# Patient Record
Sex: Female | Born: 1960 | Race: White | Hispanic: No | Marital: Married | Smoking: Never smoker
Health system: Southern US, Community
[De-identification: ages and names within clinical notes are randomized; demographics above are authoritative.]

## PROBLEM LIST (undated history)

## (undated) DIAGNOSIS — F419 Anxiety disorder, unspecified: Secondary | ICD-10-CM

## (undated) DIAGNOSIS — K589 Irritable bowel syndrome without diarrhea: Secondary | ICD-10-CM

## (undated) DIAGNOSIS — K219 Gastro-esophageal reflux disease without esophagitis: Secondary | ICD-10-CM

## (undated) DIAGNOSIS — G43909 Migraine, unspecified, not intractable, without status migrainosus: Secondary | ICD-10-CM

## (undated) DIAGNOSIS — K5901 Slow transit constipation: Secondary | ICD-10-CM

## (undated) DIAGNOSIS — F329 Major depressive disorder, single episode, unspecified: Secondary | ICD-10-CM

## (undated) DIAGNOSIS — I10 Essential (primary) hypertension: Secondary | ICD-10-CM

## (undated) DIAGNOSIS — M1990 Unspecified osteoarthritis, unspecified site: Secondary | ICD-10-CM

## (undated) DIAGNOSIS — N8000 Endometriosis of the uterus, unspecified: Secondary | ICD-10-CM

## (undated) DIAGNOSIS — N946 Dysmenorrhea, unspecified: Secondary | ICD-10-CM

## (undated) DIAGNOSIS — D219 Benign neoplasm of connective and other soft tissue, unspecified: Secondary | ICD-10-CM

## (undated) DIAGNOSIS — F32A Depression, unspecified: Secondary | ICD-10-CM

## (undated) HISTORY — PX: ESOPHAGOGASTRODUODENOSCOPY: SHX1529

## (undated) HISTORY — PX: CHOLECYSTECTOMY: SHX55

## (undated) HISTORY — PX: OOPHORECTOMY: SHX86

## (undated) HISTORY — PX: KNEE ARTHROSCOPY: SUR90

## (undated) HISTORY — PX: COLONOSCOPY: SHX174

## (undated) HISTORY — PX: PELVIC LAPAROSCOPY: SHX162

## (undated) HISTORY — PX: ABDOMINAL HYSTERECTOMY: SHX81

---

## 2004-03-04 ENCOUNTER — Ambulatory Visit: Payer: Self-pay

## 2006-02-07 ENCOUNTER — Ambulatory Visit: Payer: Self-pay | Admitting: Obstetrics and Gynecology

## 2006-02-13 ENCOUNTER — Ambulatory Visit: Payer: Self-pay | Admitting: Obstetrics and Gynecology

## 2007-05-15 ENCOUNTER — Ambulatory Visit: Payer: Self-pay | Admitting: Obstetrics and Gynecology

## 2007-10-23 ENCOUNTER — Ambulatory Visit: Payer: Self-pay | Admitting: Obstetrics and Gynecology

## 2009-07-17 ENCOUNTER — Ambulatory Visit: Payer: Self-pay | Admitting: Obstetrics and Gynecology

## 2013-10-30 ENCOUNTER — Emergency Department: Payer: Self-pay | Admitting: Emergency Medicine

## 2014-02-07 ENCOUNTER — Ambulatory Visit: Payer: Self-pay | Admitting: Obstetrics and Gynecology

## 2014-02-10 ENCOUNTER — Ambulatory Visit: Payer: Self-pay | Admitting: Internal Medicine

## 2014-02-13 ENCOUNTER — Ambulatory Visit: Payer: Self-pay | Admitting: Obstetrics and Gynecology

## 2014-03-20 ENCOUNTER — Ambulatory Visit: Payer: Self-pay | Admitting: Unknown Physician Specialty

## 2014-05-15 ENCOUNTER — Ambulatory Visit
Admit: 2014-05-15 | Disposition: A | Discharge status: Home / Self Care | Payer: Self-pay | Attending: Obstetrics and Gynecology | Admitting: Obstetrics and Gynecology

## 2014-05-20 ENCOUNTER — Encounter: Payer: Self-pay | Admitting: Obstetrics and Gynecology

## 2014-05-26 LAB — SURGICAL PATHOLOGY

## 2014-06-11 ENCOUNTER — Emergency Department
Admission: EM | Admit: 2014-06-11 | Discharge: 2014-06-11 | Disposition: A | Discharge status: Home / Self Care | Payer: BC Managed Care – PPO | Attending: Emergency Medicine | Admitting: Emergency Medicine

## 2014-06-11 ENCOUNTER — Emergency Department: Payer: BC Managed Care – PPO

## 2014-06-11 DIAGNOSIS — Z90710 Acquired absence of both cervix and uterus: Secondary | ICD-10-CM | POA: Insufficient documentation

## 2014-06-11 DIAGNOSIS — Z9089 Acquired absence of other organs: Secondary | ICD-10-CM | POA: Insufficient documentation

## 2014-06-11 DIAGNOSIS — N200 Calculus of kidney: Secondary | ICD-10-CM | POA: Diagnosis not present

## 2014-06-11 DIAGNOSIS — Z79899 Other long term (current) drug therapy: Secondary | ICD-10-CM | POA: Insufficient documentation

## 2014-06-11 DIAGNOSIS — R1030 Lower abdominal pain, unspecified: Secondary | ICD-10-CM | POA: Diagnosis present

## 2014-06-11 HISTORY — DX: Gastro-esophageal reflux disease without esophagitis: K21.9

## 2014-06-11 HISTORY — DX: Major depressive disorder, single episode, unspecified: F32.9

## 2014-06-11 HISTORY — DX: Depression, unspecified: F32.A

## 2014-06-11 LAB — COMPREHENSIVE METABOLIC PANEL
ALT: 26 U/L (ref 14–54)
AST: 24 U/L (ref 15–41)
Albumin: 4.3 g/dL (ref 3.5–5.0)
Alkaline Phosphatase: 110 U/L (ref 38–126)
Anion gap: 8 (ref 5–15)
BILIRUBIN TOTAL: 0.5 mg/dL (ref 0.3–1.2)
BUN: 17 mg/dL (ref 6–20)
CO2: 24 mmol/L (ref 22–32)
Calcium: 9.7 mg/dL (ref 8.9–10.3)
Chloride: 108 mmol/L (ref 101–111)
Creatinine, Ser: 0.63 mg/dL (ref 0.44–1.00)
GLUCOSE: 113 mg/dL — AB (ref 65–99)
Potassium: 3.7 mmol/L (ref 3.5–5.1)
Sodium: 140 mmol/L (ref 135–145)
Total Protein: 7.4 g/dL (ref 6.5–8.1)

## 2014-06-11 LAB — URINALYSIS COMPLETE WITH MICROSCOPIC (ARMC ONLY)
BACTERIA UA: NONE SEEN
BILIRUBIN URINE: NEGATIVE
GLUCOSE, UA: NEGATIVE mg/dL
Ketones, ur: NEGATIVE mg/dL
LEUKOCYTES UA: NEGATIVE
Nitrite: NEGATIVE
Protein, ur: NEGATIVE mg/dL
Specific Gravity, Urine: 1.019 (ref 1.005–1.030)
pH: 5 (ref 5.0–8.0)

## 2014-06-11 LAB — CBC WITH DIFFERENTIAL/PLATELET
Basophils Absolute: 0.1 10*3/uL (ref 0–0.1)
Basophils Relative: 1 %
EOS ABS: 0.6 10*3/uL (ref 0–0.7)
EOS PCT: 6 %
HCT: 40.6 % (ref 35.0–47.0)
HEMOGLOBIN: 13.2 g/dL (ref 12.0–16.0)
LYMPHS ABS: 1.8 10*3/uL (ref 1.0–3.6)
Lymphocytes Relative: 18 %
MCH: 25.9 pg — AB (ref 26.0–34.0)
MCHC: 32.5 g/dL (ref 32.0–36.0)
MCV: 79.7 fL — AB (ref 80.0–100.0)
MONO ABS: 0.8 10*3/uL (ref 0.2–0.9)
MONOS PCT: 8 %
Neutro Abs: 6.6 10*3/uL — ABNORMAL HIGH (ref 1.4–6.5)
Neutrophils Relative %: 67 %
Platelets: 327 10*3/uL (ref 150–440)
RBC: 5.1 MIL/uL (ref 3.80–5.20)
RDW: 15.7 % — ABNORMAL HIGH (ref 11.5–14.5)
WBC: 10 10*3/uL (ref 3.6–11.0)

## 2014-06-11 LAB — LIPASE, BLOOD: Lipase: 29 U/L (ref 22–51)

## 2014-06-11 MED ORDER — ONDANSETRON HCL 4 MG/2ML IJ SOLN
4.0000 mg | Freq: Once | INTRAMUSCULAR | Status: AC
  Administered 2014-06-11: 4 mg via INTRAVENOUS

## 2014-06-11 MED ORDER — MORPHINE SULFATE 4 MG/ML IJ SOLN
INTRAMUSCULAR | Status: AC
  Administered 2014-06-11: 4 mg via INTRAVENOUS
  Filled 2014-06-11: qty 1

## 2014-06-11 MED ORDER — IOHEXOL 300 MG/ML  SOLN
100.0000 mL | Freq: Once | INTRAMUSCULAR | Status: AC | PRN
  Administered 2014-06-11: 100 mL via INTRAVENOUS

## 2014-06-11 MED ORDER — ONDANSETRON HCL 4 MG/2ML IJ SOLN
INTRAMUSCULAR | Status: AC
  Administered 2014-06-11: 4 mg via INTRAVENOUS
  Filled 2014-06-11: qty 2

## 2014-06-11 MED ORDER — IOHEXOL 240 MG/ML SOLN
25.0000 mL | Freq: Once | INTRAMUSCULAR | Status: AC | PRN
  Administered 2014-06-11: 25 mL via ORAL

## 2014-06-11 MED ORDER — MORPHINE SULFATE 4 MG/ML IJ SOLN
4.0000 mg | Freq: Once | INTRAMUSCULAR | Status: AC
  Administered 2014-06-11: 4 mg via INTRAVENOUS

## 2014-06-11 NOTE — ED Notes (Signed)
Patient began to have pain to left lower quadrant around midnight. Vomited in triage.

## 2014-06-11 NOTE — ED Notes (Signed)
Pt in with co left sided abd pain x 1 hr, did vomit, has had some burning on urination.

## 2014-06-11 NOTE — Discharge Instructions (Signed)

## 2014-06-11 NOTE — ED Provider Notes (Signed)
[facility] Emergency Department Provider Note  ____________________________________________  Time seen: 1:00 AM  I have reviewed the triage vital signs and the nursing notes.   HISTORY  Chief Complaint Abdominal Pain      HPI Emily Walker is a 54 y.o. female presents with lower quadrant abdominal pain described as 10 out of 10 and sharp acute onset 1 hour ago and persistent. Patient also admits to one episodes of nonbloody vomiting. No diarrhea. Of note patient also admits to some dysuria.     Past Medical History  Diagnosis Date  . Depression   . GERD (gastroesophageal reflux disease)     There are no active problems to display for this patient.   Past Surgical History  Procedure Laterality Date  . Abdominal hysterectomy    . Cholecystectomy      Current Outpatient Rx  Name  Route  Sig  Dispense  Refill  . MELATONIN GUMMIES PO   Oral   Take 10 mg by mouth daily.         . RABEprazole (ACIPHEX) 20 MG tablet   Oral   Take 1 tablet by mouth daily.      12   . sertraline (ZOLOFT) 100 MG tablet   Oral   Take 100 mg by mouth every morning.      5     Allergies Flagyl  No family history on file.  Social History History  Substance Use Topics  . Smoking status: Never Smoker   . Smokeless tobacco: Never Used  . Alcohol Use: No    Review of Systems  Constitutional: Negative for fever. Eyes: Negative for visual changes. ENT: Negative for sore throat. Cardiovascular: Negative for chest pain. Respiratory: Negative for shortness of breath. Gastrointestinal: Negative for abdominal pain, vomiting and diarrhea. Genitourinary: Positive for dysuria. Musculoskeletal: Negative for back pain. Skin: Negative for rash. Neurological: Negative for headaches, focal weakness or numbness.  10-point ROS otherwise negative.  ____________________________________________   PHYSICAL EXAM:  VITAL SIGNS: ED Triage Vitals  Enc Vitals  Group     BP 06/11/14 0020 136/96 mmHg     Pulse Rate 06/11/14 0020 65     Resp 06/11/14 0020 18     Temp 06/11/14 0020 98.5 F (36.9 C)     Temp Source 06/11/14 0020 Oral     SpO2 --      Weight --      Height --      Head Cir --      Peak Flow --      Pain Score 06/11/14 0021 10     Pain Loc --      Pain Edu? --      Excl. in [place]? --     Constitutional: Alert and oriented. Well appearing and in no distress. Eyes: Conjunctivae are normal. PERRL. Normal extraocular movements. ENT   Head: Normocephalic and atraumatic.   Nose: No congestion/rhinnorhea.   Mouth/Throat: Mucous membranes are moist.   Neck: No stridor. Cardiovascular: Normal rate, regular rhythm. Normal and symmetric distal pulses are present in all extremities. No murmurs, rubs, or gallops. Respiratory: Normal respiratory effort without tachypnea nor retractions. Breath sounds are clear and equal bilaterally. No wheezes/rales/rhonchi. Gastrointestinal: Soft and nontender. No distention. There is no CVA tenderness. Genitourinary: deferred Musculoskeletal: Nontender with normal range of motion in all extremities. No joint effusions.  No lower extremity tenderness nor edema. Neurologic:  Normal speech and language. No gross focal neurologic deficits are appreciated. Speech is normal.  Skin:  Skin is warm, dry and intact. No rash noted. Psychiatric: Mood and affect are normal. Speech and behavior are normal. Patient exhibits appropriate insight and judgment.  ____________________________________________    LABS (pertinent positives/negatives) Labs Reviewed  CBC WITH DIFFERENTIAL/PLATELET - Abnormal; Notable for the following:    MCV 79.7 (*)    MCH 25.9 (*)    RDW 15.7 (*)    Neutro Abs 6.6 (*)    All other components within normal limits  COMPREHENSIVE METABOLIC PANEL - Abnormal; Notable for the following:    Glucose, Bld 113 (*)    All other components within normal limits  LIPASE, BLOOD   URINALYSIS COMPLETEWITH MICROSCOPIC ([facility])     Labs Reviewed  CBC WITH DIFFERENTIAL/PLATELET - Abnormal; Notable for the following:    MCV 79.7 (*)    MCH 25.9 (*)    RDW 15.7 (*)    Neutro Abs 6.6 (*)    All other components within normal limits  COMPREHENSIVE METABOLIC PANEL - Abnormal; Notable for the following:    Glucose, Bld 113 (*)    All other components within normal limits  LIPASE, BLOOD  URINALYSIS COMPLETEWITH MICROSCOPIC [facility])        RADIOLOGY  CT abdomen and pelvis negative  ____________________________________________    ____________________________________________   INITIAL IMPRESSION / ASSESSMENT AND PLAN / ED COURSE  Pertinent labs & imaging results that were available during my care of the patient were reviewed by me and considered in my medical decision making (see chart for details).  Given history and physical exam, CT scan of the abdomen and pelvis which was negative, urinalysis which revealed too numerous to count red blood cells likely etiology patient's pain and kidney stone. Following IV morphine and Zofran patient reevaluated and pain completely resolved resolution of pain most likely secondary to analgesia or given absence of stone on CT possibly patient haspassed her kidney stone.  ____________________________________________   FINAL CLINICAL IMPRESSION(S) / ED DIAGNOSES  Final diagnoses:  Kidney stone on left side      Gregor Hams, MD 06/11/14 [phone]

## 2014-06-17 ENCOUNTER — Encounter: Payer: Self-pay | Admitting: Genetic Counselor

## 2014-06-17 DIAGNOSIS — Z1379 Encounter for other screening for genetic and chromosomal anomalies: Secondary | ICD-10-CM | POA: Insufficient documentation

## 2014-06-17 NOTE — Progress Notes (Signed)
GENETIC TEST RESULTS  Patient Name: Emily Walker Patient Age: 54 y.o. Encounter Date: 06/17/2014  Referring Physician: Benjaman Kindler, MD   [name] was called today to discuss genetic test results. Please see the Genetics note from her visit to [facility] on 05/20/14 for a detailed discussion of her personal and family history.  GENETIC TESTING: At the time of [name]'s visit, we recommended she pursue genetic testing of multiple genes on the OvaNext gene panel. This test, which included sequencing and deletion/duplication analysis of 24 genes, was performed at [facility]. Testing was normal and did not reveal a mutation in these genes. The genes tested were ATM, BARD1, BRCA1, BRCA2, BRIP1, CDH1, CHEK2, EPCAM, MLH1, MRE11A, MSH2, MSH6, MUTYH, NBN, NF1, PALB2, PMS2, PTEN, RAD50, RAD51C, RAD51D, SMARCA4, STK11, and TP53.  We discussed with [name] that since the current test is not perfect, it is possible there may be a gene mutation that current testing cannot detect, but that chance is small. We also discussed that it is possible that a different genetic factor, which was not part of this testing or has not yet been discovered, is responsible for the cancer diagnoses in the family. Should [name] wish to discuss or pursue this additional testing, we are happy to coordinate this at any time, but do not feel that she is at significant risk of harboring a mutation in a different gene.     CANCER SCREENING: This normal result is reassuring and indicates that [name] does not likely have an increased risk of cancer due to a mutation in one of these genes. We recommended [name] continue to follow the cancer screening guidelines provided by her primary physician.   FAMILY MEMBERS: While these results are reassuring for [name], this test does not tell us anything about [name]' sister's genetic status as well as that of her cousin who had ovarian  cancer. They may wish to also have genetic counseling and testing. Please let us know if we can help facilitate testing. Genetic counselors can be located in other cities, by visiting the website of the [employer] (ArtistMovie.se) and Field seismologist for a Dietitian by zip code.  Lastly, we discussed with [name] that cancer genetics is a rapidly advancing field and it is possible that new genetic tests will be appropriate for her in the future. We encouraged her to remain in contact with [place] on an annual basis so we can update her personal and family histories, and let her know of advances in cancer genetics that may benefit the family. Our contact number was provided. [name] questions were answered to her satisfaction today, and she knows she is welcome to call anytime with additional questions.    Steele Berg, MS, [place] Certified Genetic Counselor phone: [phone] [email or web address]

## 2015-05-19 ENCOUNTER — Observation Stay
Admission: EM | Admit: 2015-05-19 | Discharge: 2015-05-20 | Disposition: A | Discharge status: Home / Self Care | Payer: BC Managed Care – PPO | Attending: Internal Medicine | Admitting: Internal Medicine

## 2015-05-19 ENCOUNTER — Encounter: Payer: Self-pay | Admitting: Emergency Medicine

## 2015-05-19 ENCOUNTER — Emergency Department: Payer: BC Managed Care – PPO

## 2015-05-19 DIAGNOSIS — R079 Chest pain, unspecified: Principal | ICD-10-CM | POA: Insufficient documentation

## 2015-05-19 DIAGNOSIS — R7989 Other specified abnormal findings of blood chemistry: Secondary | ICD-10-CM

## 2015-05-19 DIAGNOSIS — Z8249 Family history of ischemic heart disease and other diseases of the circulatory system: Secondary | ICD-10-CM | POA: Insufficient documentation

## 2015-05-19 DIAGNOSIS — K219 Gastro-esophageal reflux disease without esophagitis: Secondary | ICD-10-CM | POA: Diagnosis not present

## 2015-05-19 DIAGNOSIS — Z90710 Acquired absence of both cervix and uterus: Secondary | ICD-10-CM | POA: Insufficient documentation

## 2015-05-19 DIAGNOSIS — F329 Major depressive disorder, single episode, unspecified: Secondary | ICD-10-CM | POA: Insufficient documentation

## 2015-05-19 DIAGNOSIS — Z888 Allergy status to other drugs, medicaments and biological substances status: Secondary | ICD-10-CM | POA: Diagnosis not present

## 2015-05-19 DIAGNOSIS — Z9049 Acquired absence of other specified parts of digestive tract: Secondary | ICD-10-CM | POA: Diagnosis not present

## 2015-05-19 DIAGNOSIS — R778 Other specified abnormalities of plasma proteins: Secondary | ICD-10-CM

## 2015-05-19 LAB — CBC WITH DIFFERENTIAL/PLATELET
BASOS PCT: 1 %
Basophils Absolute: 0.1 10*3/uL (ref 0–0.1)
EOS ABS: 0.3 10*3/uL (ref 0–0.7)
Eosinophils Relative: 3 %
HCT: 41.6 % (ref 35.0–47.0)
HEMOGLOBIN: 13.8 g/dL (ref 12.0–16.0)
LYMPHS ABS: 1.9 10*3/uL (ref 1.0–3.6)
Lymphocytes Relative: 18 %
MCH: 26.6 pg (ref 26.0–34.0)
MCHC: 33.1 g/dL (ref 32.0–36.0)
MCV: 80.2 fL (ref 80.0–100.0)
Monocytes Absolute: 0.8 10*3/uL (ref 0.2–0.9)
Monocytes Relative: 8 %
NEUTROS ABS: 7.3 10*3/uL — AB (ref 1.4–6.5)
NEUTROS PCT: 70 %
Platelets: 340 10*3/uL (ref 150–440)
RBC: 5.18 MIL/uL (ref 3.80–5.20)
RDW: 15.9 % — ABNORMAL HIGH (ref 11.5–14.5)
WBC: 10.3 10*3/uL (ref 3.6–11.0)

## 2015-05-19 LAB — COMPREHENSIVE METABOLIC PANEL
ALBUMIN: 4.2 g/dL (ref 3.5–5.0)
ALT: 23 U/L (ref 14–54)
AST: 20 U/L (ref 15–41)
Alkaline Phosphatase: 118 U/L (ref 38–126)
Anion gap: 7 (ref 5–15)
BUN: 12 mg/dL (ref 6–20)
CALCIUM: 9.9 mg/dL (ref 8.9–10.3)
CO2: 26 mmol/L (ref 22–32)
CREATININE: 0.57 mg/dL (ref 0.44–1.00)
Chloride: 108 mmol/L (ref 101–111)
GFR calc Af Amer: >60 mL/min (ref >60)
GFR calc non Af Amer: >60 mL/min (ref >60)
Glucose, Bld: 106 mg/dL — ABNORMAL HIGH (ref 65–99)
Potassium: 3.7 mmol/L (ref 3.5–5.1)
Sodium: 141 mmol/L (ref 135–145)
Total Bilirubin: 0.9 mg/dL (ref 0.3–1.2)
Total Protein: 7.7 g/dL (ref 6.5–8.1)

## 2015-05-19 LAB — TROPONIN I: Troponin I: 0.06 ng/mL — ABNORMAL HIGH (ref <0.031)

## 2015-05-19 MED ORDER — NITROGLYCERIN 2 % TD OINT
1.0000 [in_us] | TOPICAL_OINTMENT | Freq: Once | TRANSDERMAL | Status: AC
  Administered 2015-05-19: 1 [in_us] via TOPICAL
  Filled 2015-05-19: qty 1

## 2015-05-19 MED ORDER — ASPIRIN EC 325 MG PO TBEC: 325.0000 mg | DELAYED_RELEASE_TABLET | Freq: Every day | ORAL | Status: DC

## 2015-05-19 MED ORDER — NITROGLYCERIN 2 % TD OINT: 0.5000 [in_us] | TOPICAL_OINTMENT | Freq: Four times a day (QID) | TRANSDERMAL | Status: DC

## 2015-05-19 MED ORDER — ASPIRIN 81 MG PO CHEW
81.0000 mg | CHEWABLE_TABLET | Freq: Every day | ORAL | Status: DC
  Administered 2015-05-20: 81 mg via ORAL
  Filled 2015-05-19: qty 1

## 2015-05-19 MED ORDER — PANTOPRAZOLE SODIUM 40 MG PO TBEC
40.0000 mg | DELAYED_RELEASE_TABLET | Freq: Every day | ORAL | Status: DC
  Administered 2015-05-19 – 2015-05-20 (×2): 40 mg via ORAL
  Filled 2015-05-19 (×2): qty 1

## 2015-05-19 MED ORDER — ASPIRIN 81 MG PO CHEW
324.0000 mg | CHEWABLE_TABLET | Freq: Once | ORAL | Status: AC
  Administered 2015-05-19: 324 mg via ORAL
  Filled 2015-05-19: qty 4

## 2015-05-19 MED ORDER — DIPHENHYDRAMINE HCL 25 MG PO CAPS
50.0000 mg | ORAL_CAPSULE | Freq: Every day | ORAL | Status: DC
  Administered 2015-05-19: 50 mg via ORAL
  Filled 2015-05-19 (×3): qty 2

## 2015-05-19 MED ORDER — IBUPROFEN 400 MG PO TABS
600.0000 mg | ORAL_TABLET | Freq: Every day | ORAL | Status: DC | PRN
  Administered 2015-05-20 (×2): 600 mg via ORAL
  Filled 2015-05-19 (×2): qty 2

## 2015-05-19 MED ORDER — GI COCKTAIL ~~LOC~~
30.0000 mL | Freq: Four times a day (QID) | ORAL | Status: DC | PRN
  Filled 2015-05-19: qty 30

## 2015-05-19 MED ORDER — ENOXAPARIN SODIUM 40 MG/0.4ML ~~LOC~~ SOLN
40.0000 mg | SUBCUTANEOUS | Status: DC
  Administered 2015-05-19: 40 mg via SUBCUTANEOUS
  Filled 2015-05-19 (×2): qty 0.4

## 2015-05-19 MED ORDER — ONDANSETRON HCL 4 MG/2ML IJ SOLN: 4.0000 mg | Freq: Four times a day (QID) | INTRAMUSCULAR | Status: DC | PRN

## 2015-05-19 MED ORDER — NITROGLYCERIN 2 % TD OINT
0.5000 [in_us] | TOPICAL_OINTMENT | Freq: Two times a day (BID) | TRANSDERMAL | Status: DC
  Filled 2015-05-19: qty 30

## 2015-05-19 MED ORDER — ACETAMINOPHEN 500 MG PO TABS
500.0000 mg | ORAL_TABLET | Freq: Every day | ORAL | Status: DC
  Administered 2015-05-19 – 2015-05-20 (×2): 500 mg via ORAL
  Filled 2015-05-19 (×2): qty 1

## 2015-05-19 MED ORDER — ACETAMINOPHEN 325 MG PO TABS: 650.0000 mg | ORAL_TABLET | ORAL | Status: DC | PRN

## 2015-05-19 MED ORDER — IBUPROFEN 400 MG PO TABS: 600.0000 mg | ORAL_TABLET | Freq: Every day | ORAL | Status: DC

## 2015-05-19 NOTE — ED Provider Notes (Signed)
[facility] Emergency Department Provider Note   ____________________________________________  Time seen:  I have reviewed the triage vital signs and the triage nursing note.  HISTORY  Chief Complaint Chest Pain   Historian Patient, husband also at the bedside  HPI Emily Walker is a 55 y.o. female is here for evaluation of chest pain. She's had several episodes since 2 days ago of central upper chest pain across the top of her chest. No nausea or sweating. No real shortness of breath. No pleuritic chest pain. One of the episodes started after drinking coffee and felt somewhat burning and GERD-like. She does have a history of GERD. She did take an extra AcipHex.  She does stress test once in the 1990s.  She follows with Dr. Ouida Sills for primary care physician.  She does not typically take aspirin. Nonsmoker. Not treated for diabetes, hypertension, or hyperlipidemia. She is overweight. Her father died in his 40s of MI.  She reports being under a lot of stress. 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    Past Medical History  Diagnosis Date  . Depression   . GERD (gastroesophageal reflux disease)     Patient Active Problem List   Diagnosis Date Noted  . Genetic testing 06/17/2014    Past Surgical History  Procedure Laterality Date  . Abdominal hysterectomy    . Cholecystectomy      Current Outpatient Rx  Name  Route  Sig  Dispense  Refill  . MELATONIN GUMMIES PO   Oral   Take 10 mg by mouth daily.         . RABEprazole (ACIPHEX) 20 MG tablet   Oral   Take 1 tablet by mouth daily.      12   . sertraline (ZOLOFT) 100 MG tablet   Oral   Take 100 mg by mouth every morning.      5     Allergies Flagyl  No family history on file.  Social History Social History  Substance Use Topics  . Smoking status: Never Smoker   . Smokeless tobacco: Never Used  . Alcohol Use: No    Review of Systems  Constitutional: Negative  for fever. Eyes: Negative for visual changes. ENT: Negative for sore throat. Cardiovascular: Positive for chest pain. Respiratory: Negative for shortness of breath. Gastrointestinal: Negative for abdominal pain, vomiting and diarrhea. Genitourinary: Negative for dysuria. Musculoskeletal: Negative for back pain. Skin: Negative for rash. Neurological: Negative for headache. 10 point Review of Systems otherwise negative ____________________________________________   PHYSICAL EXAM:  VITAL SIGNS: ED Triage Vitals  Enc Vitals Group     BP 05/19/15 1410 125/92 mmHg     Pulse Rate 05/19/15 1410 109     Resp 05/19/15 1410 16     Temp 05/19/15 1410 99.1 F (37.3 C)     Temp Source 05/19/15 1410 Oral     SpO2 05/19/15 1410 97 %     Weight 05/19/15 1410 243 lb (110.224 kg)     Height 05/19/15 1410 5\' 7"  (1.702 m)     Head Cir --      Peak Flow --      Pain Score --      Pain Loc --      Pain Edu? --      Excl. in [place]? --      Constitutional: Alert and oriented. Well appearing and in no distress. HEENT   Head: Normocephalic and atraumatic.  Eyes: Conjunctivae are normal. PERRL. Normal extraocular movements.      Ears:         Nose: No congestion/rhinnorhea.   Mouth/Throat: Mucous membranes are moist.   Neck: No stridor. Cardiovascular/Chest: Normal rate, regular rhythm.  No murmurs, rubs, or gallops. Respiratory: Normal respiratory effort without tachypnea nor retractions. Breath sounds are clear and equal bilaterally. No wheezes/rales/rhonchi. Gastrointestinal: Soft. No distention, no guarding, no rebound. Nontender.    Genitourinary/rectal:Deferred Musculoskeletal: Nontender with normal range of motion in all extremities. No joint effusions.  No lower extremity tenderness.  No edema. Neurologic:  Normal speech and language. No gross or focal neurologic deficits are appreciated. Skin:  Skin is warm, dry and intact. No rash noted. Psychiatric: Mood and affect are  normal. Speech and behavior are normal. Patient exhibits appropriate insight and judgment.  ____________________________________________   EKG I, Lisa Roca, MD, the attending physician have personally viewed and interpreted all ECGs.  110 bpm. Sinus tachycardia with occasional PVC. Narrow QRS. Normal axis. Nonspecific T wave 3 and aVF. ____________________________________________  LABS (pertinent positives/negatives)  White blood count 10.3, hemoglobin 13.8 and platelet count 340 Conference metabolic panel without significant abnormalities Troponin 0.06  ____________________________________________  RADIOLOGY All Xrays were viewed by me. Imaging interpreted by Radiologist.  Chest two-view: No active cardiopulmonary disease. __________________________________________  PROCEDURES  Procedure(s) performed: None  Critical Care performed: None  ____________________________________________   ED COURSE / ASSESSMENT AND PLAN  Pertinent labs & imaging results that were available during my care of the patient were reviewed by me and considered in my medical decision making (see chart for details).   Symptoms do sound potentially clinically consistent with GERD, however, she has not had a cardiac workup, and her troponin is minimally elevated. Her initial EKG showed sinus tachycardia with some nonspecific T-wave changes and PVCs.  When I saw her she had normal sinus rhythm and had no ongoing chest pain. Nitroglycerin paste was given. 4 baby aspirins were given.  Patient will be admitted for cardiac rule out.   CONSULTATIONS: Hospitalist, Dr. Manuella Ghazi for admission.   Patient / Family / Caregiver informed of clinical course, medical decision-making process, and agree with plan.     ___________________________________________   FINAL CLINICAL IMPRESSION(S) / ED DIAGNOSES   Final diagnoses:  Elevated troponin  Chest pain, unspecified              Note:  This dictation was prepared with Dragon dictation. Any transcriptional errors that result from this process are unintentional   Lisa Roca, MD 05/19/15 [phone]

## 2015-05-19 NOTE — ED Notes (Signed)
Reports chest pain yesterday and the day before.  No pain today.  Her MD recommended she come to ER for further eval.

## 2015-05-19 NOTE — H&P (Signed)
[facility] NAME: Emily Walker    MR#:  [id number]  DATE OF BIRTH:  1960-05-21  DATE OF ADMISSION:  05/19/2015  PRIMARY CARE PHYSICIAN: Kirk Ruths., MD   REQUESTING/REFERRING PHYSICIAN: Lisa Roca, MD  CHIEF COMPLAINT:   Chief Complaint  Patient presents with  . Chest Pain    HISTORY OF PRESENT ILLNESS:  Emily Walker  is a 55 y.o. female with a known history of GERD is being admitted for chest pain.  He has been having on and off chest pain over the weekend.  Radiating all across her chest and also in her back. One of the episodes started after drinking coffee and felt somewhat burning and GERD-like so she took AcipHex but did not get relieved.  Considering her family history of early heart disease, called her primary care physician who requested her to go to the [facility] walk-in clinic who requested her to come to the emergency department.  PAST MEDICAL HISTORY:   Past Medical History  Diagnosis Date  . Depression   . GERD (gastroesophageal reflux disease)     PAST SURGICAL HISTORY:   Past Surgical History  Procedure Laterality Date  . Abdominal hysterectomy    . Cholecystectomy      SOCIAL HISTORY:   Social History  Substance Use Topics  . Smoking status: Never Smoker   . Smokeless tobacco: Never Used  . Alcohol Use: No   works as a Nurse, adult at [employer]:  No family history on file. Father died of MI in 46s DRUG ALLERGIES:   Allergies  Allergen Reactions  . Flagyl [Metronidazole] Nausea And Vomiting    REVIEW OF SYSTEMS:   Review of Systems  Constitutional: Negative for fever, weight loss, malaise/fatigue and diaphoresis.  HENT: Negative for ear discharge, ear pain, hearing loss, nosebleeds, sore throat and tinnitus.   Eyes: Negative for blurred vision and pain.  Respiratory: Negative for cough, hemoptysis, shortness of breath and wheezing.   Cardiovascular: Positive  for chest pain. Negative for palpitations, orthopnea and leg swelling.  Gastrointestinal: Positive for heartburn. Negative for nausea, vomiting, abdominal pain, diarrhea, constipation and blood in stool.  Genitourinary: Negative for dysuria, urgency and frequency.  Musculoskeletal: Negative for myalgias and back pain.  Skin: Negative for itching and rash.  Neurological: Negative for dizziness, tingling, tremors, focal weakness, seizures, weakness and headaches.  Psychiatric/Behavioral: Negative for depression. The patient is not nervous/anxious.     MEDICATIONS AT HOME:   Prior to Admission medications   Medication Sig Start Date End Date Taking? Authorizing Provider  acetaminophen (TYLENOL) 500 MG tablet Take 500 mg by mouth daily.   Yes Historical Provider, MD  diphenhydrAMINE (BENADRYL) 25 MG tablet Take 50 mg by mouth at bedtime.   Yes Historical Provider, MD  ibuprofen (ADVIL,MOTRIN) 600 MG tablet Take 600 mg by mouth daily.   Yes Historical Provider, MD  MELATONIN GUMMIES PO Take 1 each by mouth at bedtime.    Yes Historical Provider, MD  RABEprazole (ACIPHEX) 20 MG tablet Take 20 mg by mouth daily.    Yes Historical Provider, MD      VITAL SIGNS:  Blood pressure 143/99, pulse 104, temperature 98.6 F (37 C), temperature source Oral, resp. rate 18, height 5\' 7"  (1.702 m), weight 110.224 kg (243 lb), SpO2 99 %.  PHYSICAL EXAMINATION:  Physical Exam  Constitutional: She is oriented to person, place, and time and well-developed, well-nourished, and in no distress.  HENT:  Head: Normocephalic and atraumatic.  Eyes: Conjunctivae and EOM are normal. Pupils are equal, round, and reactive to light.  Neck: Normal range of motion. Neck supple. No tracheal deviation present. No thyromegaly present.  Cardiovascular: Normal rate, regular rhythm and normal heart sounds.   Pulmonary/Chest: Effort normal and breath sounds normal. No respiratory distress. She has no wheezes. She exhibits no  tenderness.  Abdominal: Soft. Bowel sounds are normal. She exhibits no distension. There is no tenderness.  Musculoskeletal: Normal range of motion.  Neurological: She is alert and oriented to person, place, and time. No cranial nerve deficit.  Skin: Skin is warm and dry. No rash noted.  Psychiatric: Mood and affect normal.   LABORATORY PANEL:   CBC  Recent Labs Lab 05/19/15 1417  WBC 10.3  HGB 13.8  HCT 41.6  PLT 340   ------------------------------------------------------------------------------------------------------------------  Chemistries   Recent Labs Lab 05/19/15 1417  NA 141  K 3.7  CL 108  CO2 26  GLUCOSE 106*  BUN 12  CREATININE 0.57  CALCIUM 9.9  AST 20  ALT 23  ALKPHOS 118  BILITOT 0.9   ------------------------------------------------------------------------------------------------------------------  Cardiac Enzymes  Recent Labs Lab 05/19/15 1417  TROPONINI 0.06*   ------------------------------------------------------------------------------------------------------------------  RADIOLOGY:  Dg Chest 2 View  05/19/2015  CLINICAL DATA:  Chest pain since yesterday. EXAM: CHEST  2 VIEW COMPARISON:  None. FINDINGS: The lungs are clear wiithout focal pneumonia, edema, pneumothorax or pleural effusion. The cardiopericardial silhouette is within normal limits for size. The visualized bony structures of the thorax are intact. IMPRESSION: No active cardiopulmonary disease. Electronically Signed   By: Misty Stanley M.D.   On: 05/19/2015 16:52   IMPRESSION AND PLAN:  55 y.o. female is here for evaluation of chest pain  * Chest pain - Serial troponins - Telemetry - Aspirin, nitroglycerin - Myoview in the morning - Unlikely cardiac, but considering significant family history.  We will admit her under observation  * Borderline elevated troponin - Likely due to supply demand ischemia although cannot rule out MI at this time - We will do serial  troponins  * GERD - Continue PPI    All the records are reviewed and case discussed with ED provider. Management plans discussed with the patient, family and they are in agreement.  CODE STATUS: Full code  TOTAL TIME TAKING CARE OF THIS PATIENT: 35 minutes.    [facility], Ralph Benavidez M.D on 05/19/2015 at 7:18 PM  Between 7am to 6pm - Pager - [phone]  After 6pm go to [email or web address] - password [email or web address] [facility]  Office  [phone]  CC: Primary care physician; Kirk Ruths., MD   Note: This dictation was prepared with Dragon dictation along with smaller phrase technology. Any transcriptional errors that result from this process are unintentional.

## 2015-05-19 NOTE — ED Notes (Signed)
Lab called with troponin of 0.06  Dr Archie Balboa aware.

## 2015-05-19 NOTE — Progress Notes (Signed)
Patient arrived to 2A Room 237. Patient denies pain and all questions answered. Patient oriented to unit and Fall Safety Plan signed. Skin assessment completed with Alisa RN and skin intact. A&Ox4, VSS, and NSR on tele box #[id number]. Nursing staff will continue to monitor. Earleen Reaper, RN

## 2015-05-19 NOTE — ED Notes (Signed)
While updating pt's vitals pt expressed anxiety about why she brought back and what exactly it meant to have an elevated troponin, spoke with Shirlee Limerick, RN in reference to this and pt concern and she stated she would go over what that means with pt.

## 2015-05-19 NOTE — ED Notes (Addendum)
Pt reports chest pain since yesterday, not currently having pain but pt states when she had the pain it was "All over, it radiated into my neck, back and both arms" Pt reports she thinks it might be from anxiety or from drinking caffeine B/C she has GERD

## 2015-05-20 ENCOUNTER — Observation Stay: Payer: BC Managed Care – PPO

## 2015-05-20 DIAGNOSIS — R079 Chest pain, unspecified: Secondary | ICD-10-CM | POA: Diagnosis not present

## 2015-05-20 LAB — NM MYOCAR MULTI W/SPECT W/WALL MOTION / EF
CSEPED: 1 min
CSEPEDS: 22 s
CSEPEW: 1 METS
CSEPPHR: 110 {beats}/min
LV dias vol: 75 mL (ref 46–106)
LVSYSVOL: 35 mL
Rest HR: 83 {beats}/min
SDS: 0
SRS: 0
SSS: 3
TID: 0.66

## 2015-05-20 LAB — TROPONIN I
Troponin I: <0.03 ng/mL (ref <0.031)
Troponin I: <0.03 ng/mL (ref <0.031)

## 2015-05-20 MED ORDER — TECHNETIUM TC 99M SESTAMIBI - CARDIOLITE
29.7700 | Freq: Once | INTRAVENOUS | Status: AC | PRN
Start: 2015-05-20 — End: 2015-05-20
  Administered 2015-05-20: 13:00:00 29.77 via INTRAVENOUS

## 2015-05-20 MED ORDER — TECHNETIUM TC 99M SESTAMIBI - CARDIOLITE
13.3710 | Freq: Once | INTRAVENOUS | Status: AC | PRN
  Administered 2015-05-20: 13.371 via INTRAVENOUS

## 2015-05-20 MED ORDER — REGADENOSON 0.4 MG/5ML IV SOLN
0.4000 mg | Freq: Once | INTRAVENOUS | Status: AC
  Administered 2015-05-20: 0.4 mg via INTRAVENOUS

## 2015-05-20 NOTE — Progress Notes (Signed)
Came with on-off chest pain- stresstest done- negatvie. Troponin negative.  Plan   D/c home today.

## 2015-05-20 NOTE — Progress Notes (Addendum)
Patient discharged home after low risk scan, IV site DCd, bleeding controlled, tele monitor turned in, DC instructions given to patient, understanding verbalized, pt left hospital in car with her family  No new scrips

## 2015-05-22 NOTE — Discharge Summary (Signed)
[facility] [place] NAME: Emily Walker    MR#:  [id number]  DATE OF BIRTH:  09/04/1960  DATE OF ADMISSION:  05/19/2015 ADMITTING PHYSICIAN: Max Sane, MD  DATE OF DISCHARGE: 05/20/2015  4:59 PM  PRIMARY CARE PHYSICIAN: Kirk Ruths., MD    ADMISSION DIAGNOSIS:  Chest pain, unspecified [R07.9] Elevated troponin [R79.89]  DISCHARGE DIAGNOSIS:  Active Problems:   Chest pain   Stress test negative.  SECONDARY DIAGNOSIS:   Past Medical History  Diagnosis Date  . Depression   . GERD (gastroesophageal reflux disease)     HOSPITAL COURSE:   * Chest pain - Serial troponins- negatvie - Telemetry - Aspirin, nitroglycerin - Myoview in the morning- done- negative.  * Borderline elevated troponin - Likely due to supply demand ischemia   * GERD - Continue PPI  DISCHARGE CONDITIONS:   Stable.  CONSULTS OBTAINED:     DRUG ALLERGIES:   Allergies  Allergen Reactions  . Flagyl [Metronidazole] Nausea And Vomiting    DISCHARGE MEDICATIONS:   Discharge Medication List as of 05/20/2015  4:42 PM    CONTINUE these medications which have NOT CHANGED   Details  acetaminophen (TYLENOL) 500 MG tablet Take 500 mg by mouth daily., Until Discontinued, Historical Med    diphenhydrAMINE (BENADRYL) 25 MG tablet Take 50 mg by mouth at bedtime., Until Discontinued, Historical Med    ibuprofen (ADVIL,MOTRIN) 600 MG tablet Take 600 mg by mouth daily., Until Discontinued, Historical Med    MELATONIN GUMMIES PO Take 1 each by mouth at bedtime. , Until Discontinued, Historical Med    RABEprazole (ACIPHEX) 20 MG tablet Take 20 mg by mouth daily. , Until Discontinued, Historical Med         DISCHARGE INSTRUCTIONS:   Follow with PMD in 2 weeks.  If you experience worsening of your admission symptoms, develop shortness of breath, life threatening emergency, suicidal or homicidal thoughts you must seek medical attention  immediately by calling 911 or calling your MD immediately  if symptoms less severe.  You Must read complete instructions/literature along with all the possible adverse reactions/side effects for all the Medicines you take and that have been prescribed to you. Take any new Medicines after you have completely understood and accept all the possible adverse reactions/side effects.   Please note  You were cared for by a hospitalist during your hospital stay. If you have any questions about your discharge medications or the care you received while you were in the hospital after you are discharged, you can call the unit and asked to speak with the hospitalist on call if the hospitalist that took care of you is not available. Once you are discharged, your primary care physician will handle any further medical issues. Please note that NO REFILLS for any discharge medications will be authorized once you are discharged, as it is imperative that you return to your primary care physician (or establish a relationship with a primary care physician if you do not have one) for your aftercare needs so that they can reassess your need for medications and monitor your lab values.    Today   CHIEF COMPLAINT:   Chief Complaint  Patient presents with  . Chest Pain    HISTORY OF PRESENT ILLNESS:  Emily Walker  is a 56 y.o. female with a known history of GERD is being admitted for chest pain. He has been having on and off chest pain over the weekend. Radiating all across  her chest and also in her back. One of the episodes started after drinking coffee and felt somewhat burning and GERD-like so she took AcipHex but did not get relieved. Considering her family history of early heart disease, called her primary care physician who requested her to go to the [facility] walk-in clinic who requested her to come to the emergency department.    VITAL SIGNS:  Blood pressure 128/81, pulse 87, temperature 97.8 F (36.6 C),  temperature source Oral, resp. rate 18, height 5\' 7"  (1.702 m), weight 110.406 kg (243 lb 6.4 oz), SpO2 95 %.  I/O:  No intake or output data in the 24 hours ending 05/22/15 2216  PHYSICAL EXAMINATION:  GENERAL:  55 y.o.-year-old patient lying in the bed with no acute distress.  EYES: Pupils equal, round, reactive to light and accommodation. No scleral icterus. Extraocular muscles intact.  HEENT: Head atraumatic, normocephalic. Oropharynx and nasopharynx clear.  NECK:  Supple, no jugular venous distention. No thyroid enlargement, no tenderness.  LUNGS: Normal breath sounds bilaterally, no wheezing, rales,rhonchi or crepitation. No use of accessory muscles of respiration.  CARDIOVASCULAR: S1, S2 normal. No murmurs, rubs, or gallops.  ABDOMEN: Soft, non-tender, non-distended. Bowel sounds present. No organomegaly or mass.  EXTREMITIES: No pedal edema, cyanosis, or clubbing.  NEUROLOGIC: Cranial nerves II through XII are intact. Muscle strength 5/5 in all extremities. Sensation intact. Gait not checked.  PSYCHIATRIC: The patient is alert and oriented x 3.  SKIN: No obvious rash, lesion, or ulcer.   DATA REVIEW:   CBC  Recent Labs Lab 05/19/15 1417  WBC 10.3  HGB 13.8  HCT 41.6  PLT 340    Chemistries   Recent Labs Lab 05/19/15 1417  NA 141  K 3.7  CL 108  CO2 26  GLUCOSE 106*  BUN 12  CREATININE 0.57  CALCIUM 9.9  AST 20  ALT 23  ALKPHOS 118  BILITOT 0.9    Cardiac Enzymes  Recent Labs Lab 05/20/15 1016  TROPONINI <0.03    Microbiology Results  No results found for this or any previous visit.  RADIOLOGY:  No results found.  EKG:   Orders placed or performed during the hospital encounter of 05/19/15  . EKG 12-Lead  . EKG 12-Lead  . ED EKG  . ED EKG  . EKG      Management plans discussed with the patient, family and they are in agreement.  CODE STATUS:  Code Status History    Date Active Date Inactive Code Status Order ID Comments User  Context   05/19/2015  8:41 PM 05/20/2015  7:59 PM Full Code [id number]  Max Sane, MD Inpatient      TOTAL TIME TAKING CARE OF THIS PATIENT: 35 minutes.    Vaughan Basta M.D on 05/22/2015 at 10:16 PM  Between 7am to 6pm - Pager - [phone]  After 6pm go to [email or web address] - password [email or web address] [facility]  Office  [phone]  CC: Primary care physician; Kirk Ruths., MD   Note: This dictation was prepared with Dragon dictation along with smaller phrase technology. Any transcriptional errors that result from this process are unintentional.

## 2015-09-10 ENCOUNTER — Emergency Department: Payer: BC Managed Care – PPO

## 2015-09-10 ENCOUNTER — Emergency Department
Admission: EM | Admit: 2015-09-10 | Discharge: 2015-09-10 | Disposition: A | Discharge status: Home / Self Care | Payer: BC Managed Care – PPO | Attending: Emergency Medicine | Admitting: Emergency Medicine

## 2015-09-10 ENCOUNTER — Encounter: Payer: Self-pay | Admitting: Emergency Medicine

## 2015-09-10 DIAGNOSIS — R1031 Right lower quadrant pain: Secondary | ICD-10-CM | POA: Diagnosis present

## 2015-09-10 DIAGNOSIS — N201 Calculus of ureter: Secondary | ICD-10-CM

## 2015-09-10 LAB — URINALYSIS COMPLETE WITH MICROSCOPIC (ARMC ONLY)
BACTERIA UA: NONE SEEN
Bilirubin Urine: NEGATIVE
GLUCOSE, UA: NEGATIVE mg/dL
Ketones, ur: NEGATIVE mg/dL
Leukocytes, UA: NEGATIVE
Nitrite: NEGATIVE
Protein, ur: NEGATIVE mg/dL
Specific Gravity, Urine: 1.04 — ABNORMAL HIGH (ref 1.005–1.030)
pH: 6 (ref 5.0–8.0)

## 2015-09-10 LAB — COMPREHENSIVE METABOLIC PANEL
ALBUMIN: 4.2 g/dL (ref 3.5–5.0)
ALK PHOS: 93 U/L (ref 38–126)
ALT: 29 U/L (ref 14–54)
ANION GAP: 7 (ref 5–15)
AST: 25 U/L (ref 15–41)
BUN: 17 mg/dL (ref 6–20)
CALCIUM: 9.4 mg/dL (ref 8.9–10.3)
CO2: 23 mmol/L (ref 22–32)
Chloride: 110 mmol/L (ref 101–111)
Creatinine, Ser: 0.65 mg/dL (ref 0.44–1.00)
GFR calc Af Amer: >60 mL/min (ref >60)
GFR calc non Af Amer: >60 mL/min (ref >60)
GLUCOSE: 107 mg/dL — AB (ref 65–99)
POTASSIUM: 3.5 mmol/L (ref 3.5–5.1)
SODIUM: 140 mmol/L (ref 135–145)
Total Bilirubin: 0.6 mg/dL (ref 0.3–1.2)
Total Protein: 7 g/dL (ref 6.5–8.1)

## 2015-09-10 LAB — CBC
HEMATOCRIT: 39.3 % (ref 35.0–47.0)
HEMOGLOBIN: 13.5 g/dL (ref 12.0–16.0)
MCH: 27.8 pg (ref 26.0–34.0)
MCHC: 34.4 g/dL (ref 32.0–36.0)
MCV: 81 fL (ref 80.0–100.0)
Platelets: 272 10*3/uL (ref 150–440)
RBC: 4.85 MIL/uL (ref 3.80–5.20)
RDW: 15 % — ABNORMAL HIGH (ref 11.5–14.5)
WBC: 7.2 10*3/uL (ref 3.6–11.0)

## 2015-09-10 LAB — LIPASE, BLOOD: Lipase: 17 U/L (ref 11–51)

## 2015-09-10 MED ORDER — IOPAMIDOL (ISOVUE-300) INJECTION 61%
100.0000 mL | Freq: Once | INTRAVENOUS | Status: AC | PRN
  Administered 2015-09-10: 100 mL via INTRAVENOUS

## 2015-09-10 MED ORDER — DIATRIZOATE MEGLUMINE & SODIUM 66-10 % PO SOLN
15.0000 mL | Freq: Once | ORAL | Status: AC
  Administered 2015-09-10: 15 mL via ORAL

## 2015-09-10 MED ORDER — OXYCODONE-ACETAMINOPHEN 5-325 MG PO TABS
2.0000 | ORAL_TABLET | Freq: Once | ORAL | Status: AC
  Administered 2015-09-10: 2 via ORAL

## 2015-09-10 MED ORDER — MORPHINE SULFATE (PF) 4 MG/ML IV SOLN
4.0000 mg | Freq: Once | INTRAVENOUS | Status: AC
  Administered 2015-09-10: 4 mg via INTRAVENOUS
  Filled 2015-09-10: qty 1

## 2015-09-10 MED ORDER — ONDANSETRON HCL 4 MG/2ML IJ SOLN
INTRAMUSCULAR | Status: AC
  Administered 2015-09-10: 4 mg via INTRAVENOUS
  Filled 2015-09-10: qty 2

## 2015-09-10 MED ORDER — HYDROMORPHONE HCL 1 MG/ML IJ SOLN
1.0000 mg | Freq: Once | INTRAMUSCULAR | Status: AC
  Administered 2015-09-10: 1 mg via INTRAVENOUS

## 2015-09-10 MED ORDER — TAMSULOSIN HCL 0.4 MG PO CAPS: ORAL_CAPSULE | ORAL | 0 refills | Status: DC

## 2015-09-10 MED ORDER — DOCUSATE SODIUM 100 MG PO CAPS: ORAL_CAPSULE | ORAL | 0 refills | Status: AC

## 2015-09-10 MED ORDER — ONDANSETRON HCL 4 MG/2ML IJ SOLN
4.0000 mg | Freq: Once | INTRAMUSCULAR | Status: AC | PRN
  Administered 2015-09-10: 4 mg via INTRAVENOUS
  Filled 2015-09-10: qty 2

## 2015-09-10 MED ORDER — ONDANSETRON 4 MG PO TBDP: ORAL_TABLET | ORAL | 0 refills | Status: DC

## 2015-09-10 MED ORDER — ONDANSETRON HCL 4 MG/2ML IJ SOLN
4.0000 mg | INTRAMUSCULAR | Status: AC
  Administered 2015-09-10: 4 mg via INTRAVENOUS

## 2015-09-10 MED ORDER — MORPHINE SULFATE (PF) 4 MG/ML IV SOLN
INTRAVENOUS | Status: AC
  Administered 2015-09-10: 4 mg via INTRAVENOUS
  Filled 2015-09-10: qty 1

## 2015-09-10 MED ORDER — ONDANSETRON HCL 4 MG/2ML IJ SOLN
4.0000 mg | Freq: Once | INTRAMUSCULAR | Status: AC
  Administered 2015-09-10: 4 mg via INTRAVENOUS

## 2015-09-10 MED ORDER — MORPHINE SULFATE (PF) 4 MG/ML IV SOLN
4.0000 mg | Freq: Once | INTRAVENOUS | Status: AC
  Administered 2015-09-10: 4 mg via INTRAVENOUS

## 2015-09-10 MED ORDER — OXYCODONE-ACETAMINOPHEN 5-325 MG PO TABS
ORAL_TABLET | ORAL | Status: AC
  Administered 2015-09-10: 2 via ORAL
  Filled 2015-09-10: qty 2

## 2015-09-10 MED ORDER — OXYCODONE-ACETAMINOPHEN 5-325 MG PO TABS: 1.0000 | ORAL_TABLET | ORAL | 0 refills | Status: AC | PRN

## 2015-09-10 MED ORDER — HYDROMORPHONE HCL 1 MG/ML IJ SOLN
INTRAMUSCULAR | Status: AC
  Administered 2015-09-10: 1 mg via INTRAVENOUS
  Filled 2015-09-10: qty 1

## 2015-09-10 NOTE — ED Triage Notes (Addendum)
Pt to rm 12 from triage, c/o RLQ pain, states hx of kidney stones and feels similar.  Nausea reported.  Pt in mild distress, dry heaving at this time.

## 2015-09-10 NOTE — Discharge Instructions (Signed)
You have been seen in the Emergency Department (ED) today for pain that we believe based on your workup, is caused by kidney stones.  As we have discussed, please drink plenty of fluids.  Please make a follow up appointment with the physician(s) listed elsewhere in this documentation. ° °You may take pain medication as needed but ONLY as prescribed.  Please also take your prescribed Flomax daily.  We also recommend that you take over-the-counter ibuprofen regularly according to label instructions over the next 5 days.  Take it with meals to minimize stomach discomfort. ° °Please see your doctor as soon as possible as stones may take 1-3 weeks to pass and you may require additional care or medications. ° °Do not drink alcohol, drive or participate in any other potentially dangerous activities while taking opiate pain medication as it may make you sleepy. Do not take this medication with any other sedating medications, either prescription or over-the-counter. If you were prescribed Percocet or Vicodin, do not take these with acetaminophen (Tylenol) as it is already contained within these medications. °  °Take Percocet as needed for severe pain.  This medication is an opiate (or narcotic) pain medication and can be habit forming.  Use it as little as possible to achieve adequate pain control.  Do not use or use it with extreme caution if you have a history of opiate abuse or dependence.  If you are on a pain contract with your primary care doctor or a pain specialist, be sure to let them know you were prescribed this medication today from the [facility].  This medication is intended for your use only - do not give any to anyone else and keep it in a secure place where nobody else, especially children, have access to it.  It will also cause or worsen constipation, so you may want to consider taking an over-the-counter stool softener while you are taking this medication. ° °Return to the  Emergency Department (ED) or call your doctor if you have any worsening pain, fever, painful urination, are unable to urinate, or develop other symptoms that concern you. ° °

## 2015-09-10 NOTE — ED Notes (Signed)
Pt presents to ED with ABD pain to RLQ awakening from sleep. Pain is a grapping pain comes in waves. Nausea pt given IVP zofran. Denies any urine s/s. Pt states feels like the time she had a kidney stones. Will continue to monitor. No distress noted.

## 2015-09-10 NOTE — ED Notes (Signed)
MD at bedside. 

## 2015-09-10 NOTE — ED Notes (Signed)
Pt back from CT vomiting up contrast pain worse.

## 2015-09-10 NOTE — ED Notes (Signed)
Md ordering more pain meds due to pain unchanged after 1st dose of morphine.

## 2015-09-10 NOTE — ED Notes (Signed)
Reported given to Meriel Pica RN to assume care at this time

## 2015-09-10 NOTE — ED Notes (Signed)
Patient transported to CT 

## 2015-09-10 NOTE — ED Provider Notes (Signed)
[facility] Emergency Department Provider Note  ____________________________________________   First MD Initiated Contact with Patient 09/10/15 [phone]     (approximate)  I have reviewed the triage vital signs and the nursing notes.   HISTORY  Chief Complaint Abdominal Pain    HPI Emily Walker is a 55 y.o. female with multiple prior abdominal surgeries and who has had unspecified abdominal pain in the past as well as apparently a kidney stone about a year ago who presents with acute onset right lower quadrant pain that is sharp, stabbing, "grabbing", and accompanied with nausea and vomiting.  She was asleep and it woke her up and has been present for about an hour.  She denies fever/chills, chest pain, shortness of breath, dysuria, hematuria.  She states that it feels somewhat like abdominal pain she had on the left side a year ago when she had a normal CT scan but then subsequently passed a kidney stone.  She has had her gallbladder removed, a hysterectomy, a laparotomy for adhesions, but she has not had an appendectomy.  Past Medical History:  Diagnosis Date  . Depression   . GERD (gastroesophageal reflux disease)     Patient Active Problem List   Diagnosis Date Noted  . Chest pain 05/19/2015  . Genetic testing 06/17/2014    Past Surgical History:  Procedure Laterality Date  . ABDOMINAL HYSTERECTOMY    . CHOLECYSTECTOMY      Prior to Admission medications   Medication Sig Start Date End Date Taking? Authorizing Provider  acetaminophen (TYLENOL) 500 MG tablet Take 500 mg by mouth daily.    Historical Provider, MD  diphenhydrAMINE (BENADRYL) 25 MG tablet Take 50 mg by mouth at bedtime.    Historical Provider, MD  docusate sodium (COLACE) 100 MG capsule Take 1 tablet once or twice daily as needed for constipation while taking narcotic pain medicine 09/10/15   Hinda Kehr, MD  ibuprofen (ADVIL,MOTRIN) 600 MG tablet Take 600 mg by mouth daily.     Historical Provider, MD  MELATONIN GUMMIES PO Take 1 each by mouth at bedtime.     Historical Provider, MD  ondansetron (ZOFRAN ODT) 4 MG disintegrating tablet Allow 1-2 tablets to dissolve in your mouth every 8 hours as needed for nausea/vomiting 09/10/15   Hinda Kehr, MD  oxyCODONE-acetaminophen (ROXICET) 5-325 MG tablet Take 1-2 tablets by mouth every 4 (four) hours as needed for severe pain. 09/10/15   Hinda Kehr, MD  RABEprazole (ACIPHEX) 20 MG tablet Take 20 mg by mouth daily.     Historical Provider, MD  tamsulosin (FLOMAX) 0.4 MG CAPS capsule Take 1 tablet by mouth daily until you pass the kidney stone or no longer have symptoms 09/10/15   Hinda Kehr, MD    Allergies Flagyl [metronidazole]  History reviewed. No pertinent family history.  Social History Social History  Substance Use Topics  . Smoking status: Never Smoker  . Smokeless tobacco: Never Used  . Alcohol use No    Review of Systems Constitutional: No fever/chills Eyes: No visual changes. ENT: No sore throat. Cardiovascular: Denies chest pain. Respiratory: Denies shortness of breath. Gastrointestinal: RLQ abdominal pain.  +N/V.  No diarrhea.  No constipation. Genitourinary: Negative for dysuria. Musculoskeletal: Negative for back pain. Skin: Negative for rash. Neurological: Negative for headaches, focal weakness or numbness.  10-point ROS otherwise negative.  ____________________________________________   PHYSICAL EXAM:  VITAL SIGNS: ED Triage Vitals [09/10/15 0418]  Enc Vitals Group     BP (!) 158/128  Pulse Rate (!) 116     Resp 20     Temp 97.5 F (36.4 C)     Temp Source Oral     SpO2 98 %     Weight 234 lb (106.1 kg)     Height 5\' 7"  (1.702 m)     Head Circumference      Peak Flow      Pain Score 7     Pain Loc      Pain Edu?      Excl. in [place]?     Constitutional: Alert and oriented. Well appearing and in no acute distress. Eyes: Conjunctivae are normal. PERRL. EOMI. Head:  Atraumatic. Nose: No congestion/rhinnorhea. Mouth/Throat: Mucous membranes are moist.  Oropharynx non-erythematous. Neck: No stridor.  No meningeal signs.   Cardiovascular: Normal rate, regular rhythm. Good peripheral circulation. Grossly normal heart sounds.   Respiratory: Normal respiratory effort.  No retractions. Lungs CTAB. Gastrointestinal: Obese.  Soft with mild TTP of RLQ. Musculoskeletal: No lower extremity tenderness nor edema. No gross deformities of extremities. Neurologic:  Normal speech and language. No gross focal neurologic deficits are appreciated.  Skin:  Skin is warm, dry and intact. No rash noted. Psychiatric: Mood and affect are normal. Speech and behavior are normal.  ____________________________________________   LABS (all labs ordered are listed, but only abnormal results are displayed)  Labs Reviewed  COMPREHENSIVE METABOLIC PANEL - Abnormal; Notable for the following:       Result Value   Glucose, Bld 107 (*)    All other components within normal limits  CBC - Abnormal; Notable for the following:    RDW 15.0 (*)    All other components within normal limits  URINALYSIS COMPLETEWITH MICROSCOPIC ([facility] ONLY) - Abnormal; Notable for the following:    Color, Urine YELLOW (*)    APPearance CLEAR (*)    Specific Gravity, Urine 1.040 (*)    Hgb urine dipstick 2+ (*)    Squamous Epithelial / LPF 0-5 (*)    All other components within normal limits  LIPASE, BLOOD   ____________________________________________  EKG  None ____________________________________________  RADIOLOGY   Ct Abdomen Pelvis W Contrast  Result Date: 09/10/2015 CLINICAL DATA:  Acute onset of right lower quadrant abdominal pain, nausea and vomiting. Initial encounter. EXAM: CT ABDOMEN AND PELVIS WITH CONTRAST TECHNIQUE: Multidetector CT imaging of the abdomen and pelvis was performed using the standard protocol following bolus administration of intravenous contrast. CONTRAST:  165mL  ISOVUE-300 IOPAMIDOL (ISOVUE-300) INJECTION 61% COMPARISON:  CT of the abdomen and pelvis performed 06/11/2014 FINDINGS: The visualized lung bases are clear. The liver and spleen are unremarkable in appearance. The patient is status post cholecystectomy, with a clip noted at the gallbladder fossa. The pancreas and adrenal glands are unremarkable. Minimal right-sided hydronephrosis is noted, with mild right-sided perinephric stranding. There is prominence of the right ureter along its entire course, to the level of a small 3 mm obstructing stone distally at the right vesicoureteral junction. Nonobstructing bilateral renal stones are seen, more prominent on the left. No free fluid is identified. The small bowel is unremarkable in appearance. The stomach is within normal limits. No acute vascular abnormalities are seen. The appendix is normal in caliber, without evidence of appendicitis. The colon is unremarkable in appearance. The bladder is mildly distended and grossly unremarkable. The patient is status post partial hysterectomy. No suspicious adnexal masses are seen. The left ovary is unremarkable in appearance. No inguinal lymphadenopathy is seen. No acute osseous abnormalities are  identified. IMPRESSION: 1. Minimal right-sided hydronephrosis, with an obstructing small 3 mm stone noted distally at the right vesicoureteral junction. 2. Nonobstructing bilateral renal stones, more prominent on the left. Electronically Signed   By: Garald Balding M.D.   On: 09/10/2015 06:04    ____________________________________________   PROCEDURES  Procedure(s) performed:   Procedures   Critical Care performed: No ____________________________________________   INITIAL IMPRESSION / ASSESSMENT AND PLAN / ED COURSE  Pertinent labs & imaging results that were available during my care of the patient were reviewed by me and considered in my medical decision making (see chart for details).  Generally well appearing  but with tachycardia initially and mild RLQ TTP.  Suspect kidney stone versus appendicitis, well evaluated with labs and CT scan.  Clinical Course  Value Comment By Time  CT ABDOMEN PELVIS W CONTRAST 3 mm obstructing stone on the right with minimal hydronephrosis which is consistent with her symptoms.  The patient's pain is better controlled now after multiple rounds of IV medication.  I will update her anticipate prescribing her medicines for pain, nausea, Flomax, and outpatient follow-up with urology.  I am still awaiting a urinalysis to make sure she does not require antibiotics. Hinda Kehr, MD 08/10 [phone]  Leukocytes, UA: NEGATIVE No evidence of infection on urinalysis.  I will discharge the patient accordingly my previous plan.  There is no indication for antibiotics. Hinda Kehr, MD 08/10 [phone]    ____________________________________________  FINAL CLINICAL IMPRESSION(S) / ED DIAGNOSES  Final diagnoses:  Ureterolithiasis     MEDICATIONS GIVEN DURING THIS VISIT:  Medications  ondansetron (ZOFRAN) injection 4 mg (4 mg Intravenous Given 09/10/15 0428)  morphine 4 MG/ML injection 4 mg (4 mg Intravenous Given 09/10/15 0450)  diatrizoate meglumine-sodium (GASTROGRAFIN) 66-10 % solution 15 mL (15 mLs Oral Given 09/10/15 0517)  morphine 4 MG/ML injection 4 mg (4 mg Intravenous Given 09/10/15 0517)  iopamidol (ISOVUE-300) 61 % injection 100 mL (100 mLs Intravenous Contrast Given 09/10/15 0543)  ondansetron (ZOFRAN) injection 4 mg (4 mg Intravenous Given 09/10/15 0556)  HYDROmorphone (DILAUDID) injection 1 mg (1 mg Intravenous Given 09/10/15 0603)     NEW OUTPATIENT MEDICATIONS STARTED DURING THIS VISIT:  New Prescriptions   DOCUSATE SODIUM (COLACE) 100 MG CAPSULE    Take 1 tablet once or twice daily as needed for constipation while taking narcotic pain medicine   ONDANSETRON (ZOFRAN ODT) 4 MG DISINTEGRATING TABLET    Allow 1-2 tablets to dissolve in your mouth every 8 hours as needed for  nausea/vomiting   OXYCODONE-ACETAMINOPHEN (ROXICET) 5-325 MG TABLET    Take 1-2 tablets by mouth every 4 (four) hours as needed for severe pain.   TAMSULOSIN (FLOMAX) 0.4 MG CAPS CAPSULE    Take 1 tablet by mouth daily until you pass the kidney stone or no longer have symptoms      Note:  This document was prepared using Dragon voice recognition software and may include unintentional dictation errors.    Hinda Kehr, MD 09/10/15 [phone]

## 2015-09-22 ENCOUNTER — Encounter: Payer: BC Managed Care – PPO | Attending: Internal Medicine | Admitting: Dietician

## 2015-09-22 ENCOUNTER — Encounter: Payer: Self-pay | Admitting: Dietician

## 2015-09-22 VITALS — Ht 67.0 in | Wt 236.9 lb

## 2015-09-22 DIAGNOSIS — E669 Obesity, unspecified: Secondary | ICD-10-CM | POA: Diagnosis not present

## 2015-09-22 NOTE — Progress Notes (Signed)
Medical Nutrition Therapy: Visit start time:1350 end time: [id number] Assessment:  Diagnosis: obesity Past medical history: IBS, history of migraines, history of depression, GERD Psychosocial issues/ stress concerns: Patient rates her stress as moderate and indicates "ok" as to how well she is dealing with her stress Preferred learning method:  . Visual Current weight: 236.9 lbs  Height: 67 in Medications, supplements: see list Progress and evaluation:  Patient in for initial medical nutrition therapy appointment. She reports she has tried dieting in the past;lost 30 lbs with Weight Watchers and 40 lbs with Medifast diet and has tried glycemic index diet. She has made positive changes in her diet by eliminating sweetened beverages, limiting fast foods and eating less "bad carbohydrates". She has lost 10 lbs in the past 2 months. She has an overall weight goal of 155 lbs but would like to lose at least 15 more lbs before an October event. She is presently making healthy diet choices. Diet is low in non-starchy vegetables and fiber. She is drinking 6-7 cups of water daily with lime or other fruit added. Physical activity: none Dietary Intake:  Usual eating pattern includes 2-45meals and 3-4 snacks per day. Dining out frequency: 2 meals per week.  Breakfast: 5:30am- 1 cup oatmeal, blueberries, honey Does not have a lunch break but eats at least a small snack and then a small meal at 3:30 or 4:00 before going home Ex. Chicken, rice,beans or chicken salad Snack: 7:30 or 8:00pm- fruit or cheese Beverages: flavored water- unsweetened  Nutrition Care Education:  Weight control:  Commended on the positive diet changes she has made and on her weight loss. Instructed on a meal plan based on 1700 calories including carbohydrate counting and the balance of carbohydrate, protein and non-starchy vegetables. Use food guide plate and food models to demonstrate. Discussed her goal of 15 lb weight loss in the next 2  months and she agreed that may increase chance of over -restricting and regain after the event. Encouraged to add exercise to her current routine to help continue present rate of loss.  Also,discussed benefit of more fiber with weight loss efforts. Nutritional Diagnosis:  [place]-3.3 Overweight/obesity As related to previous high intake of sweetened beverages and high fat fast food choices.  As evidenced by diet history..  Also, lack of exercise is a contributing factor to weight. Intervention: Balance meals with protein, at least 2 servings of carbohydrate with range of 2-4 servings of carbohydrate and free vegetables. Spread 10-12 servings of carbohydrate over meals and snacks. Take some non-starchy vegetables to work to add to protein sources and carbohydrate. Include at least 6 oz.  of protein food daily.  Education Materials given:  . Plate Planner . Food lists/ Planning A Balanced Meal . Sample meal pattern/ menus . Goals/ instructions Learner/ who was taught:  . Patient   Level of understanding: Marland Kitchen Verbalizes understanding Learning barriers: . None  Willingness to learn/ readiness for change: . Eager, change in progress Monitoring and Evaluation:  Patient did not schedule a follow-up at this time. Gave her my name and phone # and encouraged her to call if she desires further help with her diet/nutrition.

## 2015-09-22 NOTE — Patient Instructions (Signed)
Balance meals with protein, at least 2 servings of carbohydrate with range of 2-4 servings of carbohydrate and free vegetables. Spread 10-12 servings of carbohydrate over meals and snacks. Take some non-starchy vegetables to work to add to protein sources and carbohydrate. Include at least 6 oz.  of protein food daily.

## 2016-01-02 ENCOUNTER — Encounter: Payer: Self-pay | Admitting: Emergency Medicine

## 2016-01-02 ENCOUNTER — Emergency Department: Payer: BC Managed Care – PPO

## 2016-01-02 ENCOUNTER — Emergency Department
Admission: EM | Admit: 2016-01-02 | Discharge: 2016-01-02 | Disposition: A | Discharge status: Home / Self Care | Payer: BC Managed Care – PPO | Attending: Emergency Medicine | Admitting: Emergency Medicine

## 2016-01-02 DIAGNOSIS — R51 Headache: Secondary | ICD-10-CM | POA: Insufficient documentation

## 2016-01-02 DIAGNOSIS — Z79899 Other long term (current) drug therapy: Secondary | ICD-10-CM | POA: Diagnosis not present

## 2016-01-02 DIAGNOSIS — R519 Headache, unspecified: Secondary | ICD-10-CM

## 2016-01-02 DIAGNOSIS — R112 Nausea with vomiting, unspecified: Secondary | ICD-10-CM | POA: Insufficient documentation

## 2016-01-02 HISTORY — DX: Migraine, unspecified, not intractable, without status migrainosus: G43.909

## 2016-01-02 LAB — COMPREHENSIVE METABOLIC PANEL
ALT: 26 U/L (ref 14–54)
AST: 27 U/L (ref 15–41)
Albumin: 4.2 g/dL (ref 3.5–5.0)
Alkaline Phosphatase: 112 U/L (ref 38–126)
Anion gap: 8 (ref 5–15)
BUN: 14 mg/dL (ref 6–20)
CHLORIDE: 106 mmol/L (ref 101–111)
CO2: 24 mmol/L (ref 22–32)
CREATININE: 0.56 mg/dL (ref 0.44–1.00)
Calcium: 9.2 mg/dL (ref 8.9–10.3)
GFR calc non Af Amer: >60 mL/min (ref >60)
Glucose, Bld: 129 mg/dL — ABNORMAL HIGH (ref 65–99)
Potassium: 3.7 mmol/L (ref 3.5–5.1)
SODIUM: 138 mmol/L (ref 135–145)
Total Bilirubin: 0.5 mg/dL (ref 0.3–1.2)
Total Protein: 7.8 g/dL (ref 6.5–8.1)

## 2016-01-02 LAB — CBC WITH DIFFERENTIAL/PLATELET
BASOS ABS: 0.1 10*3/uL (ref 0–0.1)
Basophils Relative: 1 %
EOS ABS: 0.2 10*3/uL (ref 0–0.7)
EOS PCT: 2 %
HCT: 40.7 % (ref 35.0–47.0)
Hemoglobin: 13.6 g/dL (ref 12.0–16.0)
Lymphocytes Relative: 13 %
Lymphs Abs: 1 10*3/uL (ref 1.0–3.6)
MCH: 26.9 pg (ref 26.0–34.0)
MCHC: 33.3 g/dL (ref 32.0–36.0)
MCV: 80.8 fL (ref 80.0–100.0)
Monocytes Absolute: 0.3 10*3/uL (ref 0.2–0.9)
Monocytes Relative: 4 %
Neutro Abs: 6 10*3/uL (ref 1.4–6.5)
Neutrophils Relative %: 80 %
PLATELETS: 289 10*3/uL (ref 150–440)
RBC: 5.04 MIL/uL (ref 3.80–5.20)
RDW: 15.3 % — ABNORMAL HIGH (ref 11.5–14.5)
WBC: 7.6 10*3/uL (ref 3.6–11.0)

## 2016-01-02 MED ORDER — METOCLOPRAMIDE HCL 5 MG/ML IJ SOLN
10.0000 mg | Freq: Once | INTRAMUSCULAR | Status: AC
  Administered 2016-01-02: 10 mg via INTRAVENOUS
  Filled 2016-01-02: qty 2

## 2016-01-02 MED ORDER — SODIUM CHLORIDE 0.9 % IV SOLN
Freq: Once | INTRAVENOUS | Status: AC
  Administered 2016-01-02: 12:00:00 via INTRAVENOUS

## 2016-01-02 MED ORDER — DIPHENHYDRAMINE HCL 50 MG/ML IJ SOLN
25.0000 mg | Freq: Once | INTRAMUSCULAR | Status: AC
  Administered 2016-01-02: 25 mg via INTRAVENOUS
  Filled 2016-01-02: qty 1

## 2016-01-02 MED ORDER — KETOROLAC TROMETHAMINE 30 MG/ML IJ SOLN
30.0000 mg | Freq: Once | INTRAMUSCULAR | Status: AC
Start: 2016-01-02 — End: 2016-01-02
  Administered 2016-01-02: 30 mg via INTRAVENOUS
  Filled 2016-01-02: qty 1

## 2016-01-02 MED ORDER — BUTALBITAL-APAP-CAFFEINE 50-325-40 MG PO TABS: 1.0000 | ORAL_TABLET | Freq: Four times a day (QID) | ORAL | 0 refills | Status: AC | PRN

## 2016-01-02 NOTE — ED Notes (Signed)
Pt returned from CT at this time.  

## 2016-01-02 NOTE — ED Notes (Signed)
AAOx3.  Skin warm and dry.  Ambulates with easy and steady gait. NAD 

## 2016-01-02 NOTE — ED Notes (Signed)
Pt taken to CT at this time.

## 2016-01-02 NOTE — ED Provider Notes (Signed)
[facility] Emergency Department Provider Note        Time seen: ----------------------------------------- 11:32 AM on 01/02/2016 -----------------------------------------    I have reviewed the triage vital signs and the nursing notes.   HISTORY  Chief Complaint Headache    HPI Emily Walker is a 55 y.o. female who presents to ER for headache that started this morning at 7:00 when she woke up. Patient states she had neck supple headache that radiates into the frontal scalp bilaterally. She took Motrin, Percocet and Sudafed without any relief. She denies any focal neurologic deficits such as numbness or weakness. Patient states the pain also seems to radiate into her shoulders, she has had nausea and photophobia.   Past Medical History:  Diagnosis Date  . Depression   . GERD (gastroesophageal reflux disease)   . Migraines     Patient Active Problem List   Diagnosis Date Noted  . Chest pain 05/19/2015  . Genetic testing 06/17/2014    Past Surgical History:  Procedure Laterality Date  . ABDOMINAL HYSTERECTOMY    . CHOLECYSTECTOMY      Allergies Flagyl [metronidazole]  Social History Social History  Substance Use Topics  . Smoking status: Never Smoker  . Smokeless tobacco: Never Used  . Alcohol use No    Review of Systems Constitutional: Negative for fever. Cardiovascular: Negative for chest pain. Respiratory: Negative for shortness of breath. Gastrointestinal: Negative for abdominal pain, Positive for nausea and vomiting Genitourinary: Negative for dysuria. Musculoskeletal: Negative for back pain. Skin: Negative for rash. Neurological: Positive for headache  10-point ROS otherwise negative.  ____________________________________________   PHYSICAL EXAM:  VITAL SIGNS: ED Triage Vitals  Enc Vitals Group     BP 01/02/16 1101 (!) 142/102     Pulse Rate 01/02/16 1101 93     Resp 01/02/16 1101 18     Temp 01/02/16 1101 98 F  (36.7 C)     Temp Source 01/02/16 1101 Oral     SpO2 01/02/16 1101 100 %     Weight 01/02/16 1102 240 lb (108.9 kg)     Height 01/02/16 1102 5\' 8"  (1.727 m)     Head Circumference --      Peak Flow --      Pain Score 01/02/16 1102 7     Pain Loc --      Pain Edu? --      Excl. in [place]? --     Constitutional: Alert and oriented. Well appearing and in no distress. Eyes: Conjunctivae are normal. PERRL. Normal extraocular movements. ENT   Head: Normocephalic and atraumatic.   Nose: No congestion/rhinnorhea.   Mouth/Throat: Mucous membranes are moist.   Neck: No stridor. Cardiovascular: Normal rate, regular rhythm. No murmurs, rubs, or gallops. Respiratory: Normal respiratory effort without tachypnea nor retractions. Breath sounds are clear and equal bilaterally. No wheezes/rales/rhonchi. Gastrointestinal: Soft and nontender. Normal bowel sounds Musculoskeletal: Nontender with normal range of motion in all extremities. No lower extremity tenderness nor edema. Neurologic:  Normal speech and language. No gross focal neurologic deficits are appreciated.  Skin:  Skin is warm, dry and intact. No rash noted. Psychiatric: Mood and affect are normal. Speech and behavior are normal.  ____________________________________________  ED COURSE:  Pertinent labs & imaging results that were available during my care of the patient were reviewed by me and considered in my medical decision making (see chart for details). Clinical Course   Patient is in no distress, will assess with labs and consider imaging.  Procedures ____________________________________________   LABS (pertinent positives/negatives)  Labs Reviewed  CBC WITH DIFFERENTIAL/PLATELET - Abnormal; Notable for the following:       Result Value   RDW 15.3 (*)    All other components within normal limits  COMPREHENSIVE METABOLIC PANEL - Abnormal; Notable for the following:    Glucose, Bld 129 (*)    All other components  within normal limits    RADIOLOGY Images were viewed by me IMPRESSION: Normal examination. ____________________________________________  FINAL ASSESSMENT AND PLAN  Headache  Plan: Patient with labs and imaging as dictated above. Patient is currently feeling better, no clear etiology for her headache. CT scan was performed in 6 hours of onset making subarachnoid hemorrhage unlikely. She'll be discharged with Fioricet to take as needed.   Earleen Newport, MD   Note: This dictation was prepared with Dragon dictation. Any transcriptional errors that result from this process are unintentional    Earleen Newport, MD 01/02/16 1357

## 2016-01-02 NOTE — ED Notes (Signed)
Pt hooked back up to monitors, given warm blankets. Requests something to drink at this time. Explained to patient would speak with MD regarding drinking something.

## 2016-01-02 NOTE — ED Triage Notes (Addendum)
C/o posterior headache that started this morning at 0700 when she woke up. Took motrin/percocet/sudafed combo but no relief. Denies any numbness/weakness. No facial droop. Pain radiates into shoulder from head. Has had nausea. Does have photophobia. Hx migraines, feels similar but pain is worse than normally. Also took zofran.

## 2016-01-02 NOTE — ED Notes (Signed)
Pt unhooked from monitor to go to the bathroom. Pt's husband at bedside at this time to assist patient. NAD noted at this time.

## 2016-05-12 ENCOUNTER — Other Ambulatory Visit: Payer: Self-pay | Admitting: Obstetrics and Gynecology

## 2016-05-12 DIAGNOSIS — N6452 Nipple discharge: Secondary | ICD-10-CM

## 2016-05-12 DIAGNOSIS — N644 Mastodynia: Secondary | ICD-10-CM

## 2016-05-20 ENCOUNTER — Ambulatory Visit: Payer: BC Managed Care – PPO

## 2016-05-20 ENCOUNTER — Other Ambulatory Visit: Payer: BC Managed Care – PPO

## 2016-05-23 ENCOUNTER — Other Ambulatory Visit: Payer: BC Managed Care – PPO

## 2016-05-23 ENCOUNTER — Ambulatory Visit: Payer: BC Managed Care – PPO

## 2016-05-27 ENCOUNTER — Ambulatory Visit
Admission: RE | Admit: 2016-05-27 | Discharge: 2016-05-27 | Disposition: A | Discharge status: Home / Self Care | Payer: BC Managed Care – PPO | Source: Ambulatory Visit | Attending: Obstetrics and Gynecology | Admitting: Obstetrics and Gynecology

## 2016-05-27 DIAGNOSIS — N644 Mastodynia: Secondary | ICD-10-CM

## 2016-05-27 DIAGNOSIS — N6452 Nipple discharge: Secondary | ICD-10-CM | POA: Insufficient documentation

## 2016-05-27 DIAGNOSIS — N6323 Unspecified lump in the left breast, lower outer quadrant: Secondary | ICD-10-CM | POA: Insufficient documentation

## 2016-07-19 ENCOUNTER — Emergency Department
Admission: EM | Admit: 2016-07-19 | Discharge: 2016-07-19 | Disposition: A | Discharge status: Home / Self Care | Payer: BC Managed Care – PPO | Attending: Emergency Medicine | Admitting: Emergency Medicine

## 2016-07-19 ENCOUNTER — Emergency Department: Payer: BC Managed Care – PPO

## 2016-07-19 DIAGNOSIS — R1012 Left upper quadrant pain: Secondary | ICD-10-CM | POA: Diagnosis present

## 2016-07-19 DIAGNOSIS — K219 Gastro-esophageal reflux disease without esophagitis: Secondary | ICD-10-CM | POA: Insufficient documentation

## 2016-07-19 DIAGNOSIS — Z79899 Other long term (current) drug therapy: Secondary | ICD-10-CM | POA: Insufficient documentation

## 2016-07-19 DIAGNOSIS — N201 Calculus of ureter: Secondary | ICD-10-CM | POA: Diagnosis not present

## 2016-07-19 LAB — URINALYSIS, COMPLETE (UACMP) WITH MICROSCOPIC
Bacteria, UA: NONE SEEN
Bilirubin Urine: NEGATIVE
GLUCOSE, UA: NEGATIVE mg/dL
Ketones, ur: NEGATIVE mg/dL
Leukocytes, UA: NEGATIVE
NITRITE: NEGATIVE
PH: 6 (ref 5.0–8.0)
PROTEIN: NEGATIVE mg/dL
Specific Gravity, Urine: 1.025 (ref 1.005–1.030)
Squamous Epithelial / LPF: NONE SEEN

## 2016-07-19 LAB — COMPREHENSIVE METABOLIC PANEL
ALBUMIN: 4.2 g/dL (ref 3.5–5.0)
ALK PHOS: 102 U/L (ref 38–126)
ALT: 17 U/L (ref 14–54)
AST: 19 U/L (ref 15–41)
Anion gap: 6 (ref 5–15)
BILIRUBIN TOTAL: 0.7 mg/dL (ref 0.3–1.2)
BUN: 14 mg/dL (ref 6–20)
CALCIUM: 9.3 mg/dL (ref 8.9–10.3)
CO2: 25 mmol/L (ref 22–32)
Chloride: 106 mmol/L (ref 101–111)
Creatinine, Ser: 0.48 mg/dL (ref 0.44–1.00)
GLUCOSE: 100 mg/dL — AB (ref 65–99)
POTASSIUM: 3.4 mmol/L — AB (ref 3.5–5.1)
Sodium: 137 mmol/L (ref 135–145)
TOTAL PROTEIN: 7.5 g/dL (ref 6.5–8.1)

## 2016-07-19 LAB — CBC
HEMATOCRIT: 38.8 % (ref 35.0–47.0)
HEMOGLOBIN: 13.1 g/dL (ref 12.0–16.0)
MCH: 27.4 pg (ref 26.0–34.0)
MCHC: 33.8 g/dL (ref 32.0–36.0)
MCV: 81 fL (ref 80.0–100.0)
Platelets: 314 10*3/uL (ref 150–440)
RBC: 4.79 MIL/uL (ref 3.80–5.20)
RDW: 15.4 % — AB (ref 11.5–14.5)
WBC: 7.9 10*3/uL (ref 3.6–11.0)

## 2016-07-19 MED ORDER — HYDROCODONE-ACETAMINOPHEN 5-325 MG PO TABS: 1.0000 | ORAL_TABLET | ORAL | 0 refills | Status: AC | PRN

## 2016-07-19 MED ORDER — TAMSULOSIN HCL 0.4 MG PO CAPS: 0.4000 mg | ORAL_CAPSULE | Freq: Every day | ORAL | 0 refills | Status: AC

## 2016-07-19 MED ORDER — ONDANSETRON 4 MG PO TBDP: 4.0000 mg | ORAL_TABLET | Freq: Three times a day (TID) | ORAL | 0 refills | Status: AC | PRN

## 2016-07-19 NOTE — ED Provider Notes (Signed)
[facility] Emergency Department Provider Note  Time seen: 6:27 PM  I have reviewed the triage vital signs and the nursing notes.   HISTORY  Chief Complaint Abdominal Pain    HPI Emily Walker is a 56 y.o. female with a past medical history of gastric reflux, depression, presents to the emergency department for left-sided abdominal pain. Patient states a history of kidney stones in the past. She states for the past 3 days she has been feeling intermittent pain in her left flank. States today the pain recurred but she also felt somewhat weak and nauseated. Denies any dysuria or hematuria. Patient states the left flank pain feels similar to past kidney stones. Denies any pain currently. States some nausea but denies any vomiting. Denies diarrhea. Denies chest pain or shortness of breath. Denies fever. But has had some chills.  Past Medical History:  Diagnosis Date  . Depression   . GERD (gastroesophageal reflux disease)   . Migraines     Patient Active Problem List   Diagnosis Date Noted  . Chest pain 05/19/2015  . Genetic testing 06/17/2014    Past Surgical History:  Procedure Laterality Date  . ABDOMINAL HYSTERECTOMY    . CHOLECYSTECTOMY      Prior to Admission medications   Medication Sig Start Date End Date Taking? Authorizing Provider  acetaminophen (TYLENOL) 500 MG tablet Take 500 mg by mouth daily.    [provider]  butalbital-acetaminophen-caffeine (FIORICET, ESGIC) 50-325-40 MG tablet Take 1-2 tablets by mouth every 6 (six) hours as needed for headache. 01/02/16 01/01/17  Earleen Newport, MD  diphenhydrAMINE (BENADRYL) 25 MG tablet Take 50 mg by mouth at bedtime.    [provider]  docusate sodium (COLACE) 100 MG capsule Take 1 tablet once or twice daily as needed for constipation while taking narcotic pain medicine Patient not taking: Reported on 09/22/2015 09/10/15   Hinda Kehr, MD  ibuprofen (ADVIL,MOTRIN) 600 MG  tablet Take 600 mg by mouth daily.    [provider]  MELATONIN GUMMIES PO Take 1 each by mouth at bedtime.     [provider]  ondansetron (ZOFRAN ODT) 4 MG disintegrating tablet Allow 1-2 tablets to dissolve in your mouth every 8 hours as needed for nausea/vomiting 09/10/15   Hinda Kehr, MD  oxyCODONE-acetaminophen (ROXICET) 5-325 MG tablet Take 1-2 tablets by mouth every 4 (four) hours as needed for severe pain. 09/10/15   Hinda Kehr, MD  RABEprazole (ACIPHEX) 20 MG tablet Take 20 mg by mouth daily.     [provider]  tamsulosin (FLOMAX) 0.4 MG CAPS capsule Take 1 tablet by mouth daily until you pass the kidney stone or no longer have symptoms Patient not taking: Reported on 09/22/2015 09/10/15   Hinda Kehr, MD    Allergies  Allergen Reactions  . Flagyl [Metronidazole] Nausea And Vomiting    Family History  Problem Relation Age of Onset  . Breast cancer Paternal Aunt 29    Social History Social History  Substance Use Topics  . Smoking status: Never Smoker  . Smokeless tobacco: Never Used  . Alcohol use No    Review of Systems Constitutional: Negative for fever, Some chills. Positive for generalized fatigue today. Cardiovascular: Negative for chest pain. Respiratory: Negative for shortness of breath. Gastrointestinal: Intermittent left flank pain. Genitourinary: Negative for dysuria. Musculoskeletal: Negative for back pain Neurological: Negative for headache All other ROS negative  ____________________________________________   PHYSICAL EXAM:  VITAL SIGNS: ED Triage Vitals  Enc Vitals  Group     BP 07/19/16 1733 (!) 141/82     Pulse Rate 07/19/16 1733 85     Resp 07/19/16 1733 18     Temp 07/19/16 1733 99 F (37.2 C)     Temp Source 07/19/16 1733 Oral     SpO2 07/19/16 1733 96 %     Weight 07/19/16 1733 242 lb (109.8 kg)     Height 07/19/16 1733 5\' 7"  (1.702 m)     Head Circumference --      Peak Flow --      Pain Score  07/19/16 1735 5     Pain Loc --      Pain Edu? --      Excl. in [place]? --     Constitutional: Alert and oriented. Well appearing and in no distress. Eyes: Normal exam ENT   Head: Normocephalic and atraumatic   Mouth/Throat: Mucous membranes are moist. Cardiovascular: Normal rate, regular rhythm. No murmur Respiratory: Normal respiratory effort without tachypnea nor retractions. Breath sounds are clear  Gastrointestinal: Soft and nontender. No distention.  There is no CVA tenderness. Musculoskeletal: Nontender with normal range of motion in all extremities.  Neurologic:  Normal speech and language. No gross focal neurologic deficits Skin:  Skin is warm, dry and intact.  Psychiatric: Mood and affect are normal.   ____________________________________________   RADIOLOGY  3 mm left UVJ stone.  ____________________________________________   INITIAL IMPRESSION / ASSESSMENT AND PLAN / ED COURSE  Pertinent labs & imaging results that were available during my care of the patient were reviewed by me and considered in my medical decision making (see chart for details).  The patient presents the emergency department with intermittent left flank pain along with generalized fatigue/weakness today. We will check a CT scan to rule out left-sided kidney stone. We'll check labs. Urinalysis is negative.  Labs are largely within normal limits. Patient does have a 3 mm left UVJ stone. Urinalysis sent for culture. We'll discharge the patient with Flomax fluids and pain medication. Patient agreeable to this plan.  ____________________________________________   FINAL CLINICAL IMPRESSION(S) / ED DIAGNOSES  Left flank pain Left ureteral lithiasis   Harvest Dark, MD 07/19/16 1921

## 2016-07-19 NOTE — ED Triage Notes (Signed)
Pt c/o LUQ "spasming pain" intermittent since early Monday morning with bodyaches and urinary pain. States she has a hx of kidney stones and this feels the same.Marland Kitchen

## 2016-07-19 NOTE — ED Notes (Signed)
postive trace hemoccult. This RN at bedside with MD during rectal exam. Pt tolerated well

## 2016-07-21 LAB — URINE CULTURE: Culture: NO GROWTH

## 2016-12-05 ENCOUNTER — Other Ambulatory Visit: Payer: Self-pay | Admitting: Obstetrics and Gynecology

## 2016-12-05 DIAGNOSIS — N6002 Solitary cyst of left breast: Secondary | ICD-10-CM

## 2017-01-26 ENCOUNTER — Other Ambulatory Visit: Payer: BC Managed Care – PPO

## 2017-03-21 ENCOUNTER — Ambulatory Visit
Admission: RE | Admit: 2017-03-21 | Discharge: 2017-03-21 | Disposition: A | Discharge status: Home / Self Care | Payer: BC Managed Care – PPO | Source: Ambulatory Visit | Attending: Obstetrics and Gynecology | Admitting: Obstetrics and Gynecology

## 2017-03-21 DIAGNOSIS — N6002 Solitary cyst of left breast: Secondary | ICD-10-CM | POA: Diagnosis present

## 2017-04-03 ENCOUNTER — Other Ambulatory Visit: Payer: Self-pay | Admitting: Student

## 2017-04-03 DIAGNOSIS — R292 Abnormal reflex: Secondary | ICD-10-CM

## 2017-04-03 DIAGNOSIS — R2689 Other abnormalities of gait and mobility: Secondary | ICD-10-CM

## 2017-04-03 DIAGNOSIS — M546 Pain in thoracic spine: Secondary | ICD-10-CM

## 2017-04-13 ENCOUNTER — Ambulatory Visit
Admission: RE | Admit: 2017-04-13 | Discharge: 2017-04-13 | Disposition: A | Discharge status: Home / Self Care | Payer: BC Managed Care – PPO | Source: Ambulatory Visit | Attending: Student | Admitting: Student

## 2017-04-13 DIAGNOSIS — R2689 Other abnormalities of gait and mobility: Secondary | ICD-10-CM

## 2017-04-13 DIAGNOSIS — M546 Pain in thoracic spine: Secondary | ICD-10-CM

## 2017-04-13 DIAGNOSIS — M5124 Other intervertebral disc displacement, thoracic region: Secondary | ICD-10-CM | POA: Insufficient documentation

## 2017-04-13 DIAGNOSIS — R292 Abnormal reflex: Secondary | ICD-10-CM | POA: Insufficient documentation

## 2017-04-13 DIAGNOSIS — M4802 Spinal stenosis, cervical region: Secondary | ICD-10-CM | POA: Diagnosis not present

## 2017-04-25 ENCOUNTER — Ambulatory Visit: Payer: BC Managed Care – PPO | Attending: Student

## 2017-04-25 ENCOUNTER — Other Ambulatory Visit: Payer: Self-pay

## 2017-04-25 DIAGNOSIS — Z9181 History of falling: Secondary | ICD-10-CM | POA: Insufficient documentation

## 2017-04-25 DIAGNOSIS — R293 Abnormal posture: Secondary | ICD-10-CM | POA: Diagnosis present

## 2017-04-25 DIAGNOSIS — M546 Pain in thoracic spine: Secondary | ICD-10-CM | POA: Insufficient documentation

## 2017-04-25 NOTE — Therapy (Signed)
[place] MAIN [facility] SERVICES [address] [place], [place], [address] Phone: [phone]   Fax:  [phone]  Physical Therapy Evaluation  Patient Details  Name: Emily Walker MRN: [id number] Date of Birth: [birth year] Referring Provider: Marin Olp   Encounter Date: 04/25/2017  PT End of Session - 04/25/17 1250    Visit Number  1    Number of Visits  8    Date for PT Re-Evaluation  05/23/17    PT Start Time  0800    PT Stop Time  0850    PT Time Calculation (min)  50 min    Activity Tolerance  Patient tolerated treatment well;Patient limited by pain    Behavior During Therapy  [facility] for tasks assessed/performed       Past Medical History:  Diagnosis Date  . Depression   . GERD (gastroesophageal reflux disease)   . Migraines     Past Surgical History:  Procedure Laterality Date  . ABDOMINAL HYSTERECTOMY    . CHOLECYSTECTOMY      There were no vitals filed for this visit.   Subjective Assessment - 04/25/17 0814    Subjective  Patient is a pleasant 57 year old female who presents to physical therapy for thoracic pain and imbalance.     Pertinent History   Emily HEDEEN is a 57 y.o. female who presents with the chief complaint of thoracic back pain that is been going on for approximately 7 years and worsening. Pain is primarily right paraspinal from the medial aspect of scapula stopping right above lumbar region. Pain is described as aching and spasms. Symptoms have gotten to the point that she must take ibuprofen and Tylenol daily in order to tolerate pain. Pain is currently 2/10; at best 1/10 at worst 5/10. Symptoms are aggravated with walking, bending, lifting, cooking; relieved with heat and medications. 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 States progressive issues with balance but denies any recent falls.    Limitations  Lifting;Standing;Walking;House hold activities;Other (comment)    How long can you  stand comfortably?  half the day     How long can you walk comfortably?  .25 miles    Patient Stated Goals  strengthen muscles so that patient can improve balance and decrease pain    Currently in Pain?  Yes    Pain Score  1     Pain Location  Back    Pain Orientation  Mid    Pain Descriptors / Indicators  Aching    Pain Type  Chronic pain    Pain Onset  More than a month ago    Pain Frequency  Constant    Aggravating Factors   certain movements     Pain Relieving Factors  pain medication, heat , laying down    Effect of Pain on Daily Activities  limits work       [facility] PT Assessment - 04/25/17 0001      Assessment   Medical Diagnosis  thoracic pain, imbalance    Referring Provider  amanda ferri    Onset Date/Surgical Date  04/26/14    Hand Dominance  Right    Prior Therapy  yes for kyphosis in high school       Precautions   Precautions  None      Restrictions   Weight Bearing Restrictions  No      Balance Screen   Has the patient fallen in the past 6 months  Yes    How many times?  2    Has the patient had a decrease in activity level because of a fear of falling?   Yes    Is the patient reluctant to leave their home because of a fear of falling?   No      Home Environment   Living Environment  Private residence    Living Arrangements  Spouse/significant other;Children    Available Help at Discharge  Family    Type of [place] to enter    Entrance Stairs-Number of Steps  9    Entrance Stairs-Rails  Right;Left    Home Layout  Two level    Alternate Level Stairs-Number of Steps  13    Alternate Level Stairs-Rails  Right    Home Equipment  None      Prior Function   Level of Independence  Independent    Vocation  Full time employment    Occupational psychologist: standing, walking,     Leisure  read, photography, hiking. exercise.       Cognition   Overall Cognitive Status  Within Functional Limits for tasks assessed       Observation/Other Assessments   Observations  Excessive forward head rounded shoulders, kyhposis, low muscle tone in LE's       Sensation   Light Touch  Appears Intact      Coordination   Gross Motor Movements are Fluid and Coordinated  Yes      Posture/Postural Control   Posture/Postural Control  Postural limitations    Postural Limitations  Rounded Shoulders;Forward head;Increased thoracic kyphosis;Flexed trunk      ROM / Strength   AROM / PROM / Strength  AROM;PROM;Strength      AROM   AROM Assessment Site  Thoracic    Thoracic Flexion  WFL (excessive kyphosis)    Thoracic Extension  limited by kyphosis     Thoracic - Right Side [facility]    Thoracic - Left Side Bend  painful    Thoracic - Right Rotation  painful    Thoracic - Left Rotation  [facility]      Strength   Overall Strength  Deficits    Strength Assessment Site  Shoulder;Elbow;Hip;Knee    Right/Left Shoulder  Right;Left    Right Shoulder Flexion  4/5    Right Shoulder Extension  4/5    Right Shoulder ABduction  4/5    Right Shoulder Internal Rotation  4-/5    Right Shoulder External Rotation  4-/5    Left Shoulder Flexion  4/5    Left Shoulder Extension  4/5    Left Shoulder ABduction  4/5    Left Shoulder Internal Rotation  4-/5    Left Shoulder External Rotation  4-/5    Right/Left Elbow  Right;Left    Right Elbow Flexion  4/5    Right Elbow Extension  4/5    Left Elbow Flexion  4/5    Left Elbow Extension  4/5    Right/Left Hip  Right;Left    Right Hip Flexion  4/5    Right Hip Extension  3/5    Right Hip ABduction  4-/5    Right Hip ADduction  4/5    Left Hip Flexion  4/5    Left Hip Extension  3/5    Left Hip ABduction  4-/5    Left Hip ADduction  4/5    Right/Left Knee  Right;Left    Right Knee Flexion  4/5    Right Knee Extension  4/5    Left Knee Flexion  4/5    Left Knee Extension  4/5      Palpation   Spinal mobility  Hypombile UPA and CPA thoracic region, painful with R UPA and with CPAs       Transfers   Transfers  Independent with all Transfers      Ambulation/Gait   Ambulation/Gait  No    Assistive device  None    Gait Pattern  Step-through pattern;Scissoring;Decreased trunk rotation;Narrow base of support      Standardized Balance Assessment   Standardized Balance Assessment  Berg Balance Test      Berg Balance Test   Sit to Stand  Able to stand without using hands and stabilize independently    Standing Unsupported  Able to stand safely 2 minutes    Sitting with Back Unsupported but Feet Supported on Floor or Stool  Able to sit safely and securely 2 minutes    Stand to Sit  Sits safely with minimal use of hands    Transfers  Able to transfer safely, minor use of hands    Standing Unsupported with Eyes Closed  Able to stand 10 seconds with supervision    Standing Ubsupported with Feet Together  Able to place feet together independently and stand 1 minute safely    From Standing, Reach Forward with Outstretched Arm  Can reach forward >12 cm safely (5")    From Standing Position, Pick up Object from Floor  Able to pick up shoe safely and easily    From Standing Position, Turn to Look Behind Over each Shoulder  Looks behind one side only/other side shows less weight shift    Turn 360 Degrees  Able to turn 360 degrees safely in 4 seconds or less    Standing Unsupported, Alternately Place Feet on Step/Stool  Able to stand independently and complete 8 steps >20 seconds    Standing Unsupported, One Foot in Front  Able to plae foot ahead of the other independently and hold 30 seconds    Standing on One Leg  Able to lift leg independently and hold > 10 seconds    Total Score  51      PAIN: Worst 5-7/10 Best 1/10  SPECIAL TESTS: -slump test  Supine pectoral tightness measure: shoulder: 6 cm left shoulder, 3 cm right shoulder    OUTCOME MEASURES: TEST Outcome Interpretation          FABQPA 8 Not fearful of physical activity  MODI 20% Mod disability   Berg Balance  Assessment 51/56 <36/56 (100% risk for falls), 37-45 (80% risk for falls); 46-51 (>50% risk for falls); 52-55 (lower risk <25% of falls)       Treat: Robber pectoral stretch Elastic band rows 10x Wall posture 3x10 seconds            No data recorded  Objective measurements completed on examination: See above findings.              PT Education - 04/25/17 1000    Education provided  Yes    Education Details  POC, HEP,     Person(s) Educated  Patient    Methods  Explanation;Demonstration;Verbal cues    Comprehension  Verbalized understanding;Returned demonstration       PT Short Term Goals - 04/25/17 1658      PT SHORT TERM GOAL #1   Title  Patient will be independent in home exercise program to improve strength/mobility for better functional independence with ADLs.    Baseline  HEP given    Time  2    Period  Weeks    Status  New    Target Date  05/09/17      PT SHORT TERM GOAL #2   Title  Patient will report a worst pain of 5/10 on VAS in thoracic region  to improve tolerance with ADLs and reduced symptoms with activities    Baseline  3/26: 7/10     Time  2    Period  Weeks    Status  New    Target Date  05/09/17      PT SHORT TERM GOAL #3   Title  Patient will deny any falls over past 2 weeks to demonstrate improved safety awareness at home and work.     Baseline  multiple stumbles     Time  2    Period  Weeks    Status  New    Target Date  05/09/17        PT Long Term Goals - 04/25/17 1703      PT LONG TERM GOAL #1   Title  Patient will report a worst pain of 3/10 on VAS in thoracic region  to improve tolerance with ADLs and reduced symptoms with activities.     Baseline  7/10     Time  4    Period  Weeks    Status  New    Target Date  05/23/17      PT LONG TERM GOAL #2   Title  Patient will tolerate sitting unsupported demonstrating erect sitting posture with minimal thoracic kyphosis for 15+ minutes with maximum of 5/10 back pain to  demonstrate improved back extensor strength and improved sitting tolerance.     Baseline  excessive kyphotic posturing    Time  4    Period  Weeks    Status  New    Target Date  05/23/17      PT LONG TERM GOAL #3   Title  Patient will reduce modified Oswestry score to <15 as to demonstrate minimal disability with ADLs including improved sleeping tolerance, walking/sitting tolerance etc for better mobility with ADLs.     Baseline  3/26: 20%    Time  4    Period  Weeks    Status  New    Target Date  05/23/17      PT LONG TERM GOAL #4   Title  Patient will increase BLE gross strength to 4+/5 as to improve functional strength for independent gait, increased standing tolerance and increased ADL ability.    Baseline  4/5 strength    Time  4    Period  Weeks    Status  New    Target Date  05/23/17      PT LONG TERM GOAL #5   Title  Patient will increase Berg Balance score to >/= 55/56 to demonstrate decreased fall risk during functional activities.    Baseline  51/56    Time  4    Period  Weeks    Status  New    Target Date  05/23/17             Plan - 04/25/17 1251    Clinical Impression Statement  Patient is a pleasant 57 year old female who presents with thoracic back pain and imbalance issues. History of excessive  kyphosis present with progressive worsening of back pain reducing function and creating limited stability due to altered COM. Hypomobile thoracic region with CPAs and R UPAs painful initially. Weak postural muscles, LE, and UE musculature noted with tight pectoral musculature. MODI=20% , BERG 51/56. Patient would benefit from skilled physical therapy to decrease pain, improve posture, and decrease falls risk for improved quality of life.     History and Personal Factors relevant to plan of care:  This patient presents with 2, personal factors/ comorbidities and 3 ,  body elements including body structures and functions, activity limitations and or participation  restrictions. Patient's condition is  evolving,    Clinical Presentation  Evolving    Clinical Presentation due to:  pain and balance progressively worsening     Clinical Decision Making  Moderate    Rehab Potential  Fair    Clinical Impairments Affecting Rehab Potential  (+) age, education, desire to improve (-) chronicity of pain    PT Frequency  2x / week    PT Duration  4 weeks    PT Treatment/Interventions  ADLs/Self Care Home Management;Aquatic Therapy;Traction;Iontophoresis 4mg /ml Dexamethasone;Electrical Stimulation;Cryotherapy;Ultrasound;Gait training;Therapeutic exercise;Therapeutic activities;Functional mobility training;Stair training;Balance training;Neuromuscular re-education;Patient/family education;Manual techniques;Passive range of motion;Dry needling    PT Next Visit Plan  mobilize spine, improve posture; review HEP    PT Home Exercise Plan  see sheet    Consulted and Agree with Plan of Care  Patient       Patient will benefit from skilled therapeutic intervention in order to improve the following deficits and impairments:  Abnormal gait, Decreased activity tolerance, Decreased balance, Decreased endurance, Decreased mobility, Decreased range of motion, Decreased strength, Difficulty walking, Hypomobility, Impaired perceived functional ability, Impaired flexibility, Postural dysfunction, Improper body mechanics, Pain  Visit Diagnosis: Pain in thoracic spine  History of falling  Abnormal posture     Problem List Patient Active Problem List   Diagnosis Date Noted  . Chest pain 05/19/2015  . Genetic testing 06/17/2014  Janna Arch, PT, DPT    Janna Arch 04/25/2017, 5:09 PM  [place] MAIN [facility] SERVICES [address] [place], [place], [address] Phone: [phone]   Fax:  [phone]  Name: Emily Walker MRN: [id number] Date of Birth: 08/05/1960

## 2017-05-04 ENCOUNTER — Ambulatory Visit: Payer: BC Managed Care – PPO | Attending: Student

## 2017-05-04 DIAGNOSIS — R293 Abnormal posture: Secondary | ICD-10-CM | POA: Diagnosis present

## 2017-05-04 DIAGNOSIS — Z9181 History of falling: Secondary | ICD-10-CM | POA: Diagnosis present

## 2017-05-04 DIAGNOSIS — M546 Pain in thoracic spine: Secondary | ICD-10-CM | POA: Diagnosis present

## 2017-05-04 NOTE — Therapy (Signed)
[place] MAIN [facility] SERVICES [address] [place], [place], [address] Phone: [phone]   Fax:  [phone]  Physical Therapy Treatment  Patient Details  Name: Collette Pescador MRN: [id number] Date of Birth: [birth year] Referring Provider: Marin Olp   Encounter Date: 05/04/2017  PT End of Session - 05/04/17 1014    Visit Number  2    Number of Visits  8    Date for PT Re-Evaluation  05/23/17    PT Start Time  1007    PT Stop Time  1047    PT Time Calculation (min)  40 min    Activity Tolerance  Patient tolerated treatment well;Patient limited by pain    Behavior During Therapy  [facility] for tasks assessed/performed       Past Medical History:  Diagnosis Date  . Depression   . GERD (gastroesophageal reflux disease)   . Migraines     Past Surgical History:  Procedure Laterality Date  . ABDOMINAL HYSTERECTOMY    . CHOLECYSTECTOMY      There were no vitals filed for this visit.  Subjective Assessment - 05/04/17 1011    Subjective  Patient reports non compliance with HEP due to being busy and having headaches. No stumbles or falls since last session.     Pertinent History   Emily Walker is a 57 y.o. female who presents with the chief complaint of thoracic back pain that is been going on for approximately 7 years and worsening. Pain is primarily right paraspinal from the medial aspect of scapula stopping right above lumbar region. Pain is described as aching and spasms. Symptoms have gotten to the point that she must take ibuprofen and Tylenol daily in order to tolerate pain. Pain is currently 2/10; at best 1/10 at worst 5/10. Symptoms are aggravated with walking, bending, lifting, cooking; relieved with heat and medications. 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 States progressive issues with balance but denies any recent falls.    Limitations  Lifting;Standing;Walking;House hold activities;Other (comment)    How long  can you stand comfortably?  half the day     How long can you walk comfortably?  .25 miles    Patient Stated Goals  strengthen muscles so that patient can improve balance and decrease pain    Currently in Pain?  Yes    Pain Score  1     Pain Location  Back    Pain Orientation  Mid    Pain Descriptors / Indicators  Aching    Pain Type  Chronic pain    Pain Onset  More than a month ago    Pain Frequency  Constant       Supine: Robber pectoral stretch 2x20 seconds ER OTB 2x20 seconds AP GH mobilizations grade I-III AAROM flexion with dowel 5x10 second holds   Prone:  UPAs and CPAs grade I-IIIcervicothoracic region 3x10 seconds each level : painful and hypomobile throughout region  Modified prone pressups onto elbows to promote thoracic extension 10x 3 second holds   Elastic band RTB rows 10x Wall posture 6x10 seconds Single arm row cable machine 2.5# 10x each arm, tactile cues for body positioning                          PT Education - 05/04/17 1013    Education provided  Yes    Education Details  HEP compliance, manual tx, exercise technique  Person(s) Educated  Patient    Methods  Explanation;Demonstration;Verbal cues    Comprehension  Verbalized understanding;Returned demonstration       PT Short Term Goals - 04/25/17 1658      PT SHORT TERM GOAL #1   Title  Patient will be independent in home exercise program to improve strength/mobility for better functional independence with ADLs.    Baseline  HEP given    Time  2    Period  Weeks    Status  New    Target Date  05/09/17      PT SHORT TERM GOAL #2   Title  Patient will report a worst pain of 5/10 on VAS in thoracic region  to improve tolerance with ADLs and reduced symptoms with activities    Baseline  3/26: 7/10     Time  2    Period  Weeks    Status  New    Target Date  05/09/17      PT SHORT TERM GOAL #3   Title  Patient will deny any falls over past 2 weeks to demonstrate  improved safety awareness at home and work.     Baseline  multiple stumbles     Time  2    Period  Weeks    Status  New    Target Date  05/09/17        PT Long Term Goals - 04/25/17 1703      PT LONG TERM GOAL #1   Title  Patient will report a worst pain of 3/10 on VAS in thoracic region  to improve tolerance with ADLs and reduced symptoms with activities.     Baseline  7/10     Time  4    Period  Weeks    Status  New    Target Date  05/23/17      PT LONG TERM GOAL #2   Title  Patient will tolerate sitting unsupported demonstrating erect sitting posture with minimal thoracic kyphosis for 15+ minutes with maximum of 5/10 back pain to demonstrate improved back extensor strength and improved sitting tolerance.     Baseline  excessive kyphotic posturing    Time  4    Period  Weeks    Status  New    Target Date  05/23/17      PT LONG TERM GOAL #3   Title  Patient will reduce modified Oswestry score to <15 as to demonstrate minimal disability with ADLs including improved sleeping tolerance, walking/sitting tolerance etc for better mobility with ADLs.     Baseline  3/26: 20%    Time  4    Period  Weeks    Status  New    Target Date  05/23/17      PT LONG TERM GOAL #4   Title  Patient will increase BLE gross strength to 4+/5 as to improve functional strength for independent gait, increased standing tolerance and increased ADL ability.    Baseline  4/5 strength    Time  4    Period  Weeks    Status  New    Target Date  05/23/17      PT LONG TERM GOAL #5   Title  Patient will increase Berg Balance score to >/= 55/56 to demonstrate decreased fall risk during functional activities.    Baseline  51/56    Time  4    Period  Weeks    Status  New    Target  Date  05/23/17            Plan - 05/04/17 1051    Clinical Impression Statement  Patient has noted tenderness to mobilizations of spine limiting depth and duration. Postural stretches and strengthening interventions  implemented with PT educated on importance of compliance of HEP for progression/improvement. Patient will continue to benefit from skilled physical therapy to decrease pain, improve posture, and decrease falls risk for improved quality of life.     Rehab Potential  Fair    Clinical Impairments Affecting Rehab Potential  (+) age, education, desire to improve (-) chronicity of pain    PT Frequency  2x / week    PT Duration  4 weeks    PT Treatment/Interventions  ADLs/Self Care Home Management;Aquatic Therapy;Traction;Iontophoresis 4mg /ml Dexamethasone;Electrical Stimulation;Cryotherapy;Ultrasound;Gait training;Therapeutic exercise;Therapeutic activities;Functional mobility training;Stair training;Balance training;Neuromuscular re-education;Patient/family education;Manual techniques;Passive range of motion;Dry needling    PT Next Visit Plan  mobilize spine, improve posture; review HEP    PT Home Exercise Plan  see sheet    Consulted and Agree with Plan of Care  Patient       Patient will benefit from skilled therapeutic intervention in order to improve the following deficits and impairments:  Abnormal gait, Decreased activity tolerance, Decreased balance, Decreased endurance, Decreased mobility, Decreased range of motion, Decreased strength, Difficulty walking, Hypomobility, Impaired perceived functional ability, Impaired flexibility, Postural dysfunction, Improper body mechanics, Pain  Visit Diagnosis: Pain in thoracic spine  History of falling  Abnormal posture     Problem List Patient Active Problem List   Diagnosis Date Noted  . Chest pain 05/19/2015  . Genetic testing 06/17/2014   Janna Arch, PT, DPT   Janna Arch 05/04/2017, 10:52 AM  [place] MAIN [facility] SERVICES [address] [place], [place], [address] Phone: [phone]   Fax:  [phone]  Name: Ara Grandmaison MRN: [id number] Date of Birth: 1960/03/11

## 2017-05-09 ENCOUNTER — Ambulatory Visit: Payer: BC Managed Care – PPO

## 2017-05-16 ENCOUNTER — Ambulatory Visit: Payer: BC Managed Care – PPO

## 2017-05-18 ENCOUNTER — Ambulatory Visit: Payer: BC Managed Care – PPO

## 2017-05-18 DIAGNOSIS — Z9181 History of falling: Secondary | ICD-10-CM

## 2017-05-18 DIAGNOSIS — R293 Abnormal posture: Secondary | ICD-10-CM

## 2017-05-18 DIAGNOSIS — M546 Pain in thoracic spine: Secondary | ICD-10-CM | POA: Diagnosis not present

## 2017-05-18 NOTE — Therapy (Signed)
[place] MAIN [facility] SERVICES [address] [place], [place], [address] Phone: [phone]   Fax:  [phone]  Physical Therapy Treatment  Patient Details  Name: Emily Walker MRN: [id number] Date of Birth: May 23, 1960 Referring Provider: Marin Olp   Encounter Date: 05/18/2017  PT End of Session - 05/18/17 1626    Visit Number  3    Number of Visits  8    Date for PT Re-Evaluation  05/23/17    PT Start Time  1031    PT Stop Time  1113    PT Time Calculation (min)  42 min    Activity Tolerance  Patient tolerated treatment well;Patient limited by pain    Behavior During Therapy  [facility] for tasks assessed/performed       Past Medical History:  Diagnosis Date  . Depression   . GERD (gastroesophageal reflux disease)   . Migraines     Past Surgical History:  Procedure Laterality Date  . ABDOMINAL HYSTERECTOMY    . CHOLECYSTECTOMY      There were no vitals filed for this visit.  Subjective Assessment - 05/18/17 1037    Subjective  Patient is feeling better and pain is starting to relieve.  Reports compliance with HEP.     Pertinent History   Emily Walker is a 57 y.o. female who presents with the chief complaint of thoracic back pain that is been going on for approximately 7 years and worsening. Pain is primarily right paraspinal from the medial aspect of scapula stopping right above lumbar region. Pain is described as aching and spasms. Symptoms have gotten to the point that she must take ibuprofen and Tylenol daily in order to tolerate pain. Pain is currently 2/10; at best 1/10 at worst 5/10. Symptoms are aggravated with walking, bending, lifting, cooking; relieved with heat and medications. 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 States progressive issues with balance but denies any recent falls.    Limitations  Lifting;Standing;Walking;House hold activities;Other (comment)    How long can you stand comfortably?   half the day     How long can you walk comfortably?  .25 miles    Patient Stated Goals  strengthen muscles so that patient can improve balance and decrease pain    Currently in Pain?  Yes    Pain Score  2     Pain Location  Back    Pain Orientation  Mid    Pain Descriptors / Indicators  Aching    Pain Type  Chronic pain    Pain Onset  More than a month ago    Pain Frequency  Constant       Supine: Robber pectoral stretch 2x20 seconds ER OTB 2x20 seconds AP GH mobilizations grade I-III SB cervical stretch with overpressure at R and L shoulder 3x20 seconds    suboccipital release 3x20 seconds   Prone:  UPAs and CPAs grade I-IIIcervicothoracic region 5x10 seconds each level :hypomobile throughout region; tender to R T10 UPA J mobilization of T1-4 4x10 seconds  Prone shoulder extension 10x    Modified prone pressups onto elbows to promote thoracic extension 10x 3 second holds    Elastic band RTB rows 10x Standing elastic band straight arm row 10x.     no pain at end of session.    Pt. response to medical necessity:  Patient will continue ot benefit from skilled physical therapy to decrease pain, improve posture, and decrease fall risk for improved quality  of life.                    PT Education - 05/18/17 1625    Education provided  Yes    Education Details  HEP compliance, exercise technique, manual, posture     Person(s) Educated  Patient    Methods  Explanation;Demonstration;Verbal cues    Comprehension  Verbalized understanding;Returned demonstration       PT Short Term Goals - 04/25/17 1658      PT SHORT TERM GOAL #1   Title  Patient will be independent in home exercise program to improve strength/mobility for better functional independence with ADLs.    Baseline  HEP given    Time  2    Period  Weeks    Status  New    Target Date  05/09/17      PT SHORT TERM GOAL #2   Title  Patient will report a worst pain of 5/10 on VAS in thoracic  region  to improve tolerance with ADLs and reduced symptoms with activities    Baseline  3/26: 7/10     Time  2    Period  Weeks    Status  New    Target Date  05/09/17      PT SHORT TERM GOAL #3   Title  Patient will deny any falls over past 2 weeks to demonstrate improved safety awareness at home and work.     Baseline  multiple stumbles     Time  2    Period  Weeks    Status  New    Target Date  05/09/17        PT Long Term Goals - 04/25/17 1703      PT LONG TERM GOAL #1   Title  Patient will report a worst pain of 3/10 on VAS in thoracic region  to improve tolerance with ADLs and reduced symptoms with activities.     Baseline  7/10     Time  4    Period  Weeks    Status  New    Target Date  05/23/17      PT LONG TERM GOAL #2   Title  Patient will tolerate sitting unsupported demonstrating erect sitting posture with minimal thoracic kyphosis for 15+ minutes with maximum of 5/10 back pain to demonstrate improved back extensor strength and improved sitting tolerance.     Baseline  excessive kyphotic posturing    Time  4    Period  Weeks    Status  New    Target Date  05/23/17      PT LONG TERM GOAL #3   Title  Patient will reduce modified Oswestry score to <15 as to demonstrate minimal disability with ADLs including improved sleeping tolerance, walking/sitting tolerance etc for better mobility with ADLs.     Baseline  3/26: 20%    Time  4    Period  Weeks    Status  New    Target Date  05/23/17      PT LONG TERM GOAL #4   Title  Patient will increase BLE gross strength to 4+/5 as to improve functional strength for independent gait, increased standing tolerance and increased ADL ability.    Baseline  4/5 strength    Time  4    Period  Weeks    Status  New    Target Date  05/23/17      PT LONG TERM GOAL #5  Title  Patient will increase Berg Balance score to >/= 55/56 to demonstrate decreased fall risk during functional activities.    Baseline  51/56    Time  4     Period  Weeks    Status  New    Target Date  05/23/17            Plan - 05/18/17 1629    Clinical Impression Statement  Patient demonstrated improved ability to obtain a more neutral postural position in seated. Improved mobility of hypomobile spine noted with decreased tenderness. Postural muscles continue to be weak and will benefit from postural strengthening. Patient will continue ot benefit from skilled physical therapy to decrease pain, improve posture, and decrease fall risk for improved quality of life.     Rehab Potential  Fair    Clinical Impairments Affecting Rehab Potential  (+) age, education, desire to improve (-) chronicity of pain    PT Frequency  2x / week    PT Duration  4 weeks    PT Treatment/Interventions  ADLs/Self Care Home Management;Aquatic Therapy;Traction;Iontophoresis 4mg /ml Dexamethasone;Electrical Stimulation;Cryotherapy;Ultrasound;Gait training;Therapeutic exercise;Therapeutic activities;Functional mobility training;Stair training;Balance training;Neuromuscular re-education;Patient/family education;Manual techniques;Passive range of motion;Dry needling    PT Next Visit Plan  mobilize spine, improve posture; review HEP    PT Home Exercise Plan  see sheet    Consulted and Agree with Plan of Care  Patient       Patient will benefit from skilled therapeutic intervention in order to improve the following deficits and impairments:  Abnormal gait, Decreased activity tolerance, Decreased balance, Decreased endurance, Decreased mobility, Decreased range of motion, Decreased strength, Difficulty walking, Hypomobility, Impaired perceived functional ability, Impaired flexibility, Postural dysfunction, Improper body mechanics, Pain  Visit Diagnosis: Pain in thoracic spine  History of falling  Abnormal posture     Problem List Patient Active Problem List   Diagnosis Date Noted  . Chest pain 05/19/2015  . Genetic testing 06/17/2014   Janna Arch, PT,  DPT   05/18/2017, 4:30 PM  [place] MAIN [facility] SERVICES [address] [place], [place], [address] Phone: [phone]   Fax:  [phone]  Name: Emily Walker MRN: [id number] Date of Birth: 1961-01-07

## 2017-05-23 ENCOUNTER — Ambulatory Visit: Payer: BC Managed Care – PPO

## 2017-05-30 ENCOUNTER — Ambulatory Visit: Payer: BC Managed Care – PPO

## 2017-06-06 ENCOUNTER — Encounter: Payer: BC Managed Care – PPO | Admitting: Physical Therapy

## 2018-06-15 IMAGING — CT CT HEAD W/O CM
3 series · 16 of 46 positions shown, 19 images · non-contrast
Comparison: None.

CLINICAL DATA: Posterior headache since 7 a.m. this morning.

EXAM:
CT HEAD WITHOUT CONTRAST
TECHNIQUE: Contiguous axial images were obtained from the base of the skull
through the vertex without intravenous contrast.

[Series 2: head wo · axial · 0.42mm/px · z∈[-181,-61]mm · 10 of 29 slices shown, 13 images]
[im 3/29  brain]
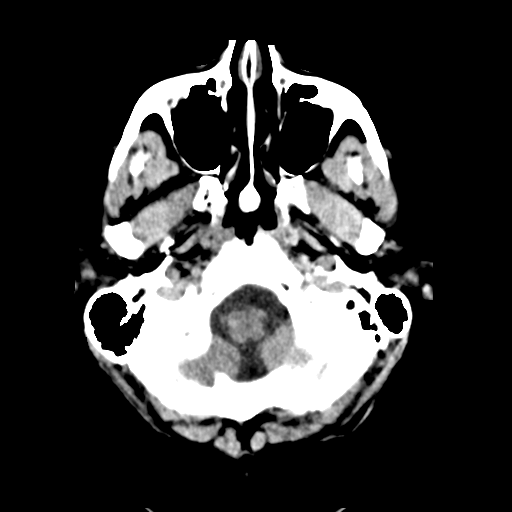
[im 3/29  bone]
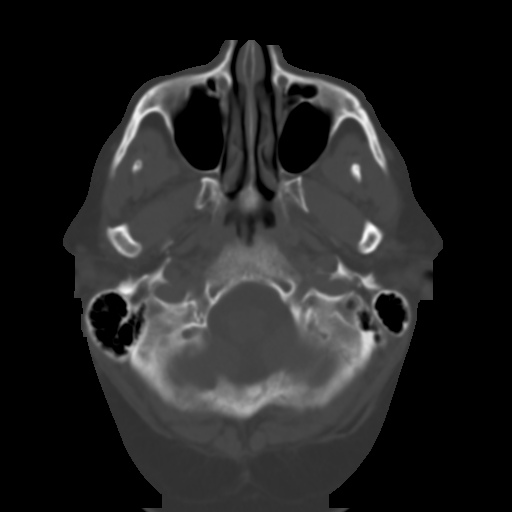
[im 6/29  brain]
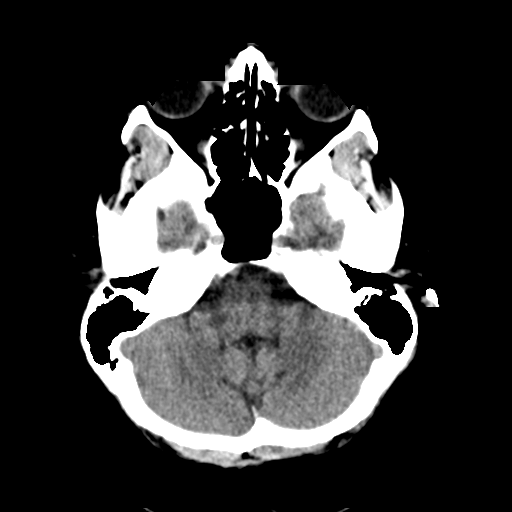
[im 8/29  brain]
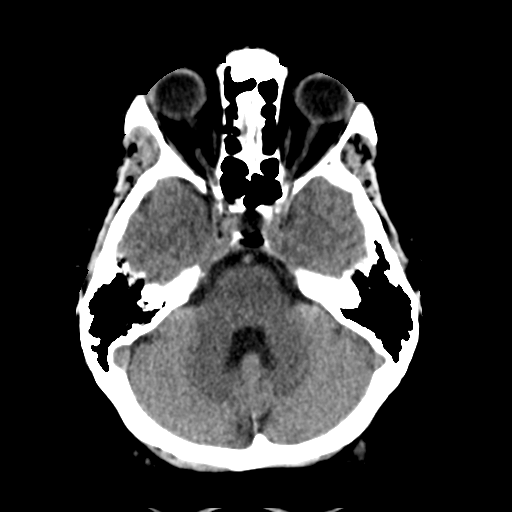
[im 11/29  brain]
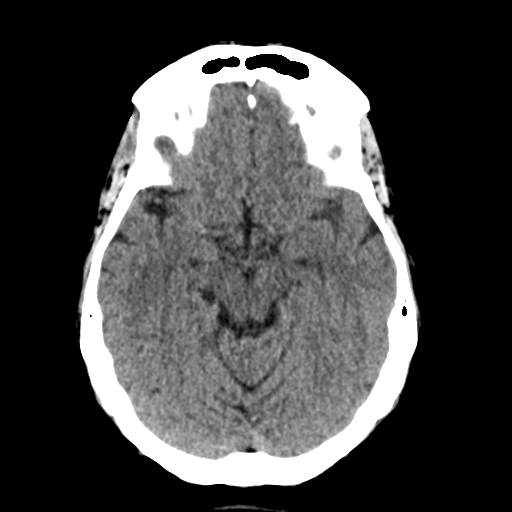
[im 14/29  brain]
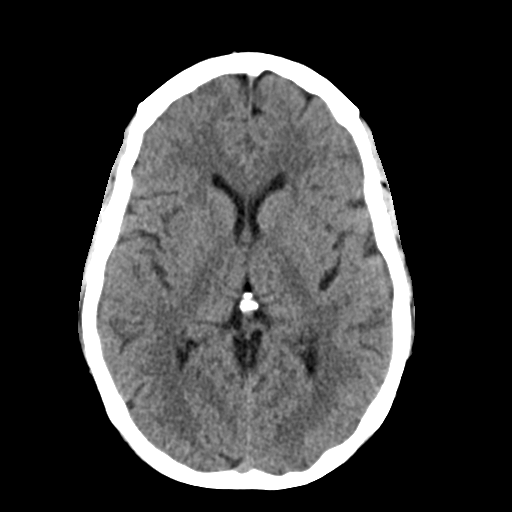
[im 14/29  bone]
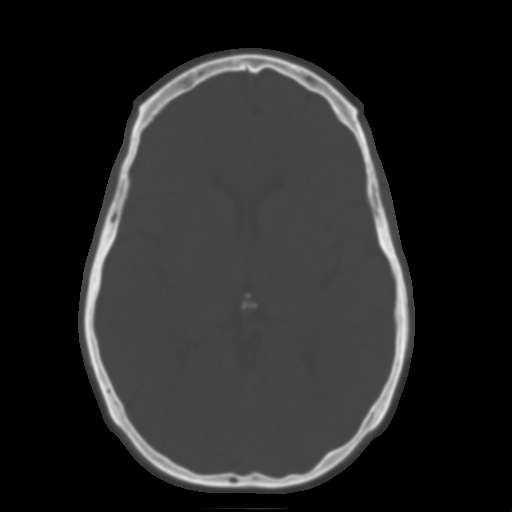
[im 16/29  brain]
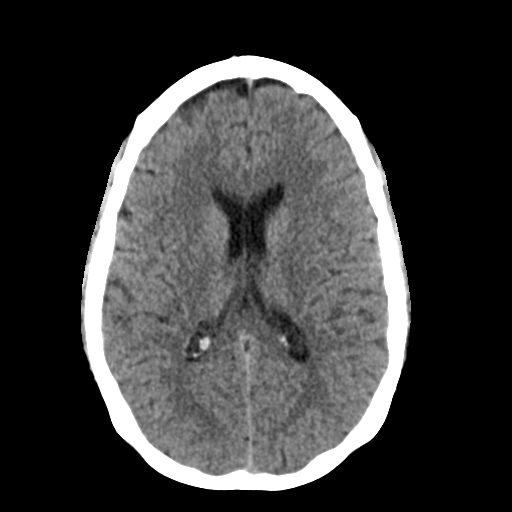
[im 19/29  brain]
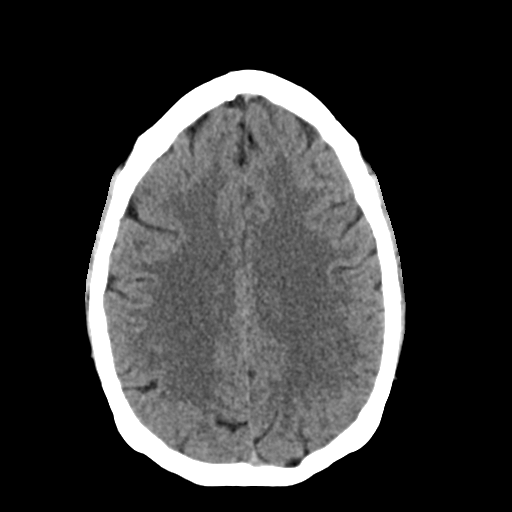
[im 22/29  brain]
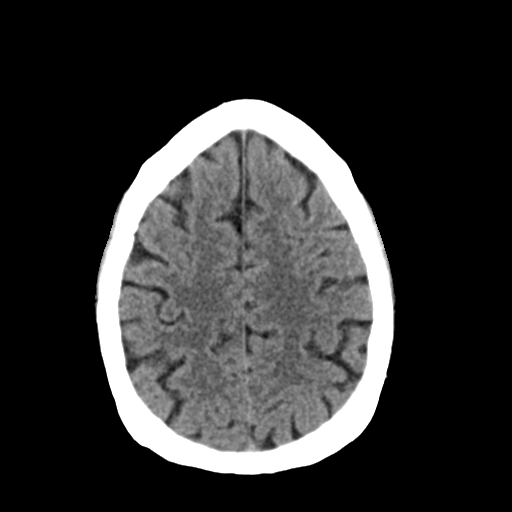
[im 24/29  brain]
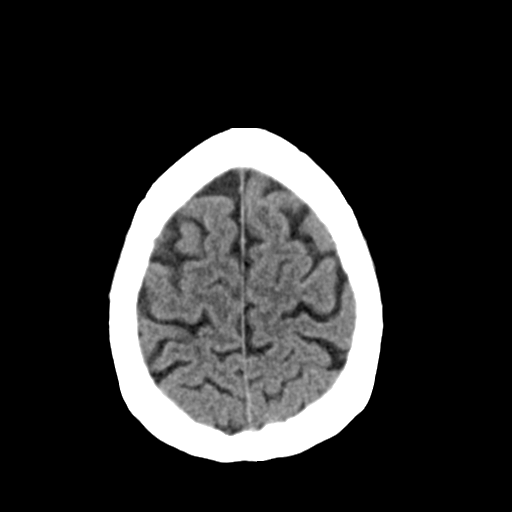
[im 24/29  bone]
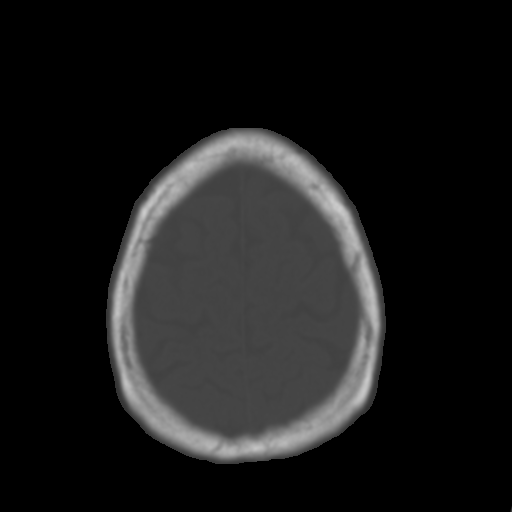
[im 27/29  brain]
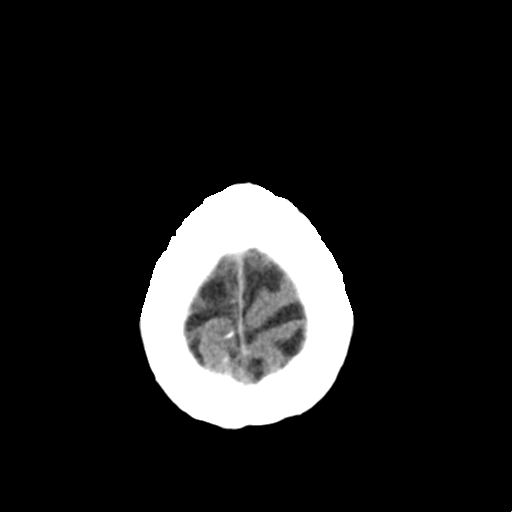

[Series 4: coronal soft tissue · coronal · 0.32mm/px · 3 of 68 slices shown]
[im 23/68  brain]
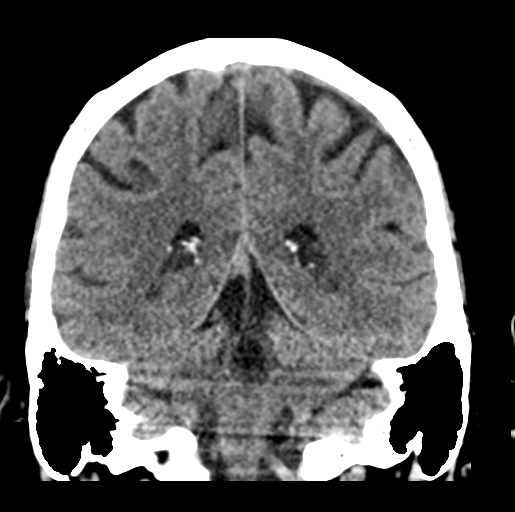
[im 30/68  brain]
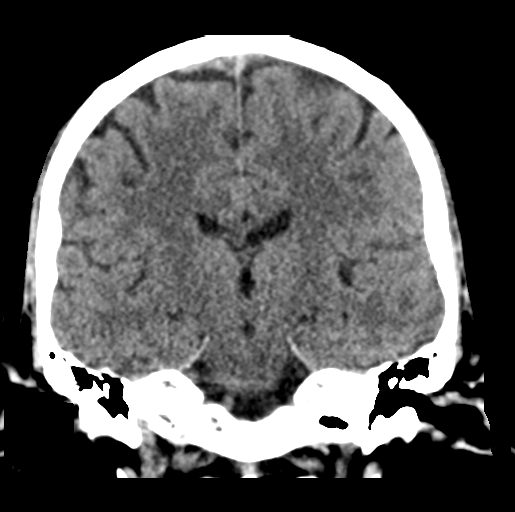
[im 38/68  brain]
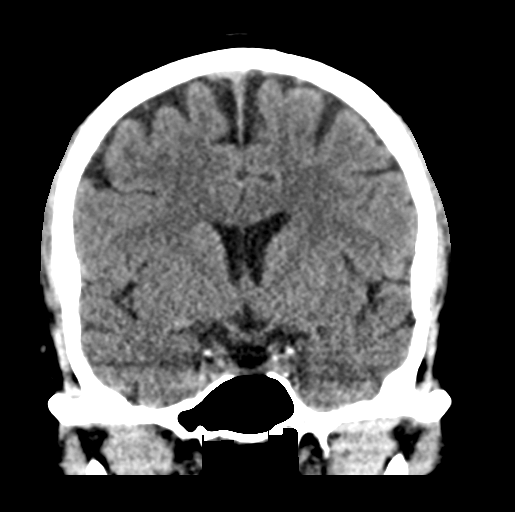

[Series 5: sagittal soft tissue · sagittal · 0.30mm/px · 3 of 57 slices shown]
[im 19/57  brain]
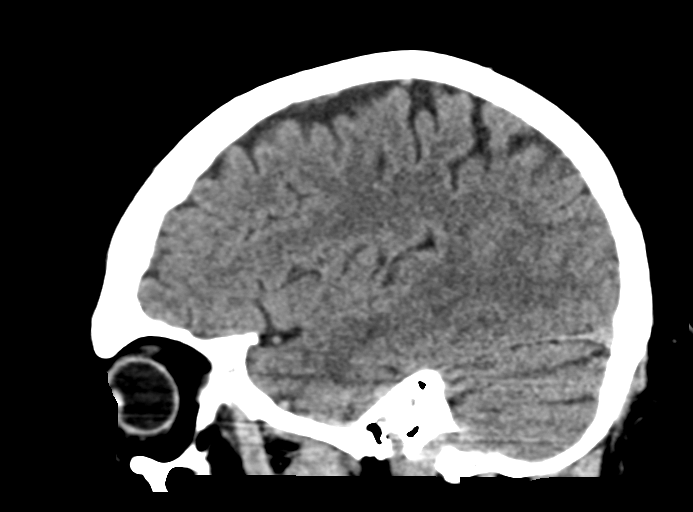
[im 29/57  brain]
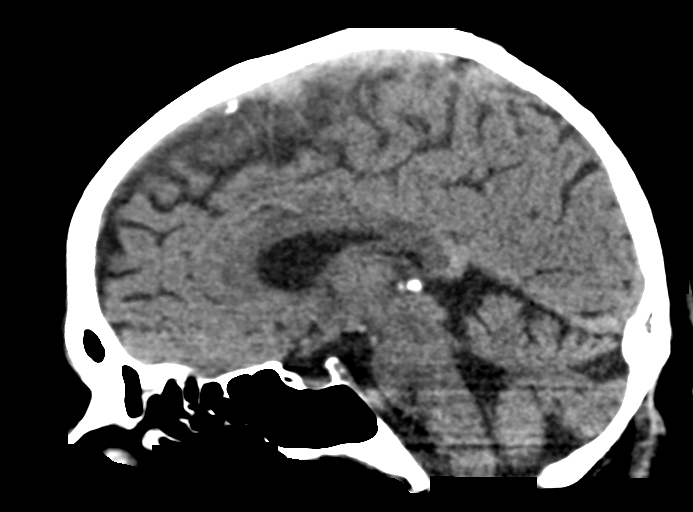
[im 38/57  brain]
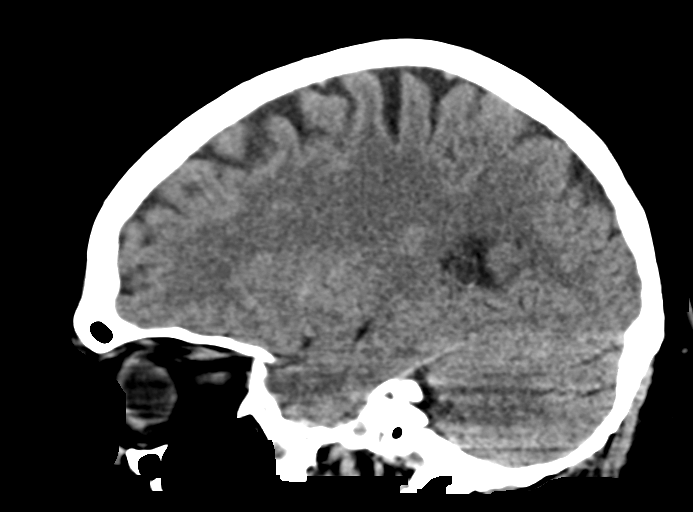

[16 of 46 positions shown; findings below may reference images not displayed]

FINDINGS: Brain: No evidence of acute infarction, hemorrhage, hydrocephalus,
extra-axial collection or mass lesion/mass effect.

Vascular: No hyperdense vessel or unexpected calcification.

Skull: Normal. Negative for fracture or focal lesion.

Sinuses/Orbits: No acute finding.

Other: None.
IMPRESSION: Normal examination.

## 2019-09-24 ENCOUNTER — Ambulatory Visit: Payer: BC Managed Care – PPO | Attending: Obstetrics and Gynecology | Admitting: Physical Therapy

## 2019-09-24 ENCOUNTER — Encounter: Payer: Self-pay | Admitting: Physical Therapy

## 2019-09-24 ENCOUNTER — Other Ambulatory Visit: Payer: Self-pay

## 2019-09-24 DIAGNOSIS — R278 Other lack of coordination: Secondary | ICD-10-CM | POA: Insufficient documentation

## 2019-09-24 DIAGNOSIS — M62838 Other muscle spasm: Secondary | ICD-10-CM | POA: Diagnosis present

## 2019-09-24 DIAGNOSIS — R102 Pelvic and perineal pain: Secondary | ICD-10-CM | POA: Diagnosis not present

## 2019-09-24 NOTE — Therapy (Signed)
[place] [facility] [address] [place], [place], [address] Phone: [phone]   Fax:  [phone]  Physical Therapy Evaluation  Patient Details  Name: Emily Walker MRN: [id number] Date of Birth: Apr 30, 1960 Referring Provider (PT): Denver Faster   Encounter Date: 09/24/2019   PT End of Session - 09/24/19 1314    Visit Number 1    Number of Visits 12    Date for PT Re-Evaluation 12/17/19    Authorization Type IE 09/24/2019    PT Start Time 1315    PT Stop Time 1405    PT Time Calculation (min) 50 min    Activity Tolerance Patient tolerated treatment well    Behavior During Therapy [facility] for tasks assessed/performed           Past Medical History:  Diagnosis Date   Depression    GERD (gastroesophageal reflux disease)    Migraines     Past Surgical History:  Procedure Laterality Date   ABDOMINAL HYSTERECTOMY     CHOLECYSTECTOMY      There were no vitals filed for this visit.  PELVIC HEALTH PHYSICAL THERAPY EVALUATION  SCREENING Red Flags: None Have you had any night sweats? Unexplained weight loss? Saddle anesthesia? Unexplained changes in bowel or bladder habits?  Precautions: None  SUBJECTIVE  Chief Complaint: Patient notes that she feels she has been dealing with RLQ and pelvic pain for a long time. Patient adds that the pain has historically been intermittent month long bouts with a year or so between. Patient adds in 2020 she had severe RLQ pain; she had an [place] which was not conclusive for anything concerning. Patient notes longstanding history of pain with gyn exams. Patient also adds that her understanding is that her PFM are tight. Patient notes that at times when she has her first morning void, she will have dull aching at the RLQ.   Pertinent History:  Falls Negative.  Scoliosis Negative. Pulmonary disease/dysfunction Negative. Surgical history: Positive for laparoscopic hysterectomy, uterine surgery .    Recent Procedures/Tests/Findings: [place] 2020 cleared.  Obstetrical History: G0P0  Gynecological History: Hysterectomy: Yes Vaginal/Abdominal Pain with exam: Yes   Urinary History: Incontinence: Positive. Onset: SUI 2015(?) UUI 2020 Triggers: coughing/sneezing; urgency Amount: Urgency: Min/SUI: Mod. Fluid Intake: 12 oz or so coffee caffeinated, crystal light packets in water Nocturia: 0-1x/night Frequency of urination: every 12 hours Pain with urination: Negative Difficulty initiating urination: Negative. Frequent UTI: Negative.   Gastrointestinal History: [place] Stool Chart: Type 4/6 Frequency of BMs: historically 1x/day; presently 2-3 x/week.  Pain with defecation: Positive Straining with defecation: Positive for 75% of the time. Incontinence: Positive for smearing. Onset: 2020 Triggers: prolonged standing after incomplete emptying Amount: Min.  Sexual activity/pain: Pain with intercourse: Positive.   Initial penetration: Yes  Deep thrustingNo   Location of pain: RLQ Current pain:  2/10  Max pain:  7/10 Least pain:  0/10 Pain quality: pain quality: aching and burning Radiating pain: Yes (lateral hip and thigh)   Patient assessment of present state: Muscles are too tight  Current activities:  Working 60 hours/week.  Patient Goals:  Decrease pain; decrease FI   OBJECTIVE  Mental Status Patient is oriented to person, place and time.  Recent memory is intact.  Remote memory is intact.  Attention span and concentration are intact.  Expressive speech is intact.  Patient's fund of knowledge is within normal limits for educational level.  POSTURE/OBSERVATIONS:  Lumbar lordosis: diminished Thoracic kyphosis: increased significantly Iliac crest height: appearing  equal bilaterally   GAIT: BOS slightly widened; anterior chain dominant   RANGE OF MOTION: deferred 2/2 extensive history taking   LEFT RIGHT  Lumbar forward flexion (65):      Lumbar extension  (30):     Lumbar lateral flexion (25):     Thoracic and Lumbar rotation (30 degrees):       Hip Flexion (0-125):      Hip IR (0-45):     Hip ER (0-45):     Hip Abduction (0-40):     Hip extension (0-15):       SENSATION: deferred 2/2 extensive history taking Grossly intact to light touch bilateral LEs as determined by testing dermatomes L2-S2 Proprioception and hot/cold testing deferred on this date  STRENGTH: MMT deferred 2/2 extensive history taking  RLE LLE  Hip Flexion    Hip Extension    Hip Abduction     Hip Adduction     Hip ER     Hip IR     Knee Extension    Knee Flexion    Dorsiflexion     Plantarflexion (seated)     ABDOMINAL: deferred 2/2 to time constraints Palpation: Diastasis: Scar mobility: Rib flare:  SPECIAL TESTS: deferred 2/2 to time constraints   PHYSICAL PERFORMANCE MEASURES: STS: WFL   EXTERNAL PELVIC EXAM: deferred 2/2 to time constraints Palpation: Breath coordination: Cued Lengthen: Cued Contraction: Cough:  INTERNAL VAGINAL EXAM: deferred 2/2 to time constraints Introitus Appears:  Skin integrity:  Scar mobility: Strength (PERF):  Symmetry: Palpation: Prolapse:     OUTCOME MEASURES: FOTO (Bowel Constipation 47; Bowel Leakage 56)   ASSESSMENT Patient is a 59 year old presenting to clinic with chief complaints of RLQ and longstanding pelvic pain. Upon examination, patient demonstrates deficits in PFM extensibility, PFM coordination, IAP management, nervous system down-regulation, posture, pain as evidenced by 7/10 pain, presence of fecal smearing after bowel emptying, increased thoracic kyphosis and diminished lumbar lordosis, and mixed urinary incontinence. Patient's responses on FOTO outcome measures (Bowel Constipation 47; Bowel Leakage 47) indicate significant functional limitations/disability/distress. Patient's progress may be limited due to personal stressors and increased work demand; however, patient's motivation and  health literacy are advantageous. Patient will benefit from continued skilled therapeutic intervention to address deficits in PFM extensibility, PFM coordination, IAP management, nervous system down-regulation, posture, pain in order to increase function and improve overall QOL.  EDUCATION Patient educated on prognosis, POC. Patient articulated understanding and returned demonstration. Patient will benefit from further education in order to maximize compliance and understanding for long-term therapeutic gains.  TREATMENT  Neuromuscular Re-education: Patient educated on primary functions of the pelvic floor including: posture/balance, sexual pleasure, storage and elimination of waste from the body, abdominal cavity closure, and breath coordination.        [facility] PT Assessment - 09/25/19 0001      Assessment   Medical Diagnosis Chronic RLQ pain and pelvic myalgia    Referring Provider (PT) Leafy Ro, B    Hand Dominance Right    Prior Therapy None for this dx      Balance Screen   Has the patient fallen in the past 6 months No                      Objective measurements completed on examination: See above findings.                    PT Long Term Goals - 09/25/19 0086  PT LONG TERM GOAL #1   Title Patient will demonstrate independence with HEP in order to maximize therapeutic gains and improve carryover from physical therapy sessions to ADLs in the home and community.    Baseline IE: not initiated    Time 12    Period Weeks    Status New    Target Date 12/17/19      PT LONG TERM GOAL #2   Title Patient will decrease worst pain as reported on NPRS by at least 2 points to demonstrate clinically significant reduction in pain in order to restore/improve function and overall QOL.    Baseline IE: 7/10    Time 12    Period Weeks    Status New    Target Date 12/17/19      PT LONG TERM GOAL #3   Title Patient will demonstrate improved function as  evidenced by a score of 58 on FOTO Bowel Constipation measure for full participation in activities at home and in the community.    Baseline IE: 47    Time 12    Period Weeks    Status New    Target Date 12/17/19      PT LONG TERM GOAL #4   Title Patient will demonstrate improved function as evidenced by a score of 61 on FOTO Bowel Leakage measure for full participation in activities at home and in the community.    Baseline IE: 47    Time 12    Period Weeks    Status New    Target Date 12/17/19      PT LONG TERM GOAL #5   Title Patient will demonstrate understanding of basic self-management/down-regulation of the nervous system for persistent pain condition and stress as evidenced by diaphragmatic breathing without cueing, body scan/progressive relaxation meditation, and improved sleep hygiene in order to transition to independent management of patient's chief complaint: pelvic pain.    Baseline IE: not demonstrated    Time 12    Period Weeks    Status New    Target Date 12/17/19                  Plan - 09/24/19 1315    Clinical Impression Statement Patient is a 59 year old presenting to clinic with chief complaints of RLQ and longstanding pelvic pain. Upon examination, patient demonstrates deficits in PFM extensibility, PFM coordination, IAP management, nervous system down-regulation, posture, pain as evidenced by 7/10 pain, presence of fecal smearing after bowel emptying, increased thoracic kyphosis and diminished lumbar lordosis, and mixed urinary incontinence. Patient's responses on FOTO outcome measures (Bowel Constipation 47; Bowel Leakage 47) indicate significant functional limitations/disability/distress. Patient's progress may be limited due to personal stressors and increased work demand; however, patient's motivation and health literacy are advantageous. Patient will benefit from continued skilled therapeutic intervention to address deficits in PFM extensibility, PFM  coordination, IAP management, nervous system down-regulation, posture, pain in order to increase function and improve overall QOL.    Personal Factors and Comorbidities Age;Comorbidity 3+;Behavior Pattern;Fitness;Time since onset of injury/illness/exacerbation;Past/Current Experience    Comorbidities depression, GERD, OSA, OA, migraines    Examination-Activity Limitations Stand;Lift;Squat;Bend;Continence    Examination-Participation Restrictions Occupation;Other;Laundry;Cleaning;Yard Work    [employer] Evolving/Moderate complexity    Clinical Decision Making Moderate    Rehab Potential Fair    PT Frequency 1x / week    PT Duration 12 weeks    PT Treatment/Interventions ADLs/Self Care Home Management;Cryotherapy;Electrical Stimulation;Aquatic Therapy;Moist Heat;Gait training;Stair training;Therapeutic activities;Functional mobility training;Neuromuscular re-education;Balance  training;Therapeutic exercise;Patient/family education;Manual techniques;Passive range of motion;Spinal Manipulations;Joint Manipulations;Dry needling;Taping;Scar mobilization    PT Next Visit Plan external PFM assessment; nervous system downregulation    PT Home Exercise Plan none provided at this time    Consulted and Agree with Plan of Care Patient           Patient will benefit from skilled therapeutic intervention in order to improve the following deficits and impairments:  Decreased balance, Decreased endurance, Decreased mobility, Pain, Postural dysfunction, Improper body mechanics, Impaired flexibility, Decreased strength, Decreased coordination, Decreased activity tolerance, Increased muscle spasms, Impaired tone, Hypomobility, Increased fascial restricitons  Visit Diagnosis: Pelvic pain  Other lack of coordination  Other muscle spasm     Problem List Patient Active Problem List   Diagnosis Date Noted   Chest pain 05/19/2015   Genetic testing 06/17/2014   Myles Gip PT,  DPT [phone]  09/25/2019, 4:11 PM  [facility] [address] [place], [place], [address] Phone: [phone]   Fax:  [phone]  Name: Emily Walker MRN: [id number] Date of Birth: [birth year]

## 2019-10-01 ENCOUNTER — Ambulatory Visit: Payer: BC Managed Care – PPO | Admitting: Physical Therapy

## 2019-10-03 ENCOUNTER — Encounter: Payer: Self-pay | Admitting: Physical Therapy

## 2019-10-03 ENCOUNTER — Other Ambulatory Visit: Payer: Self-pay

## 2019-10-03 ENCOUNTER — Ambulatory Visit: Payer: BC Managed Care – PPO | Attending: Obstetrics and Gynecology | Admitting: Physical Therapy

## 2019-10-03 DIAGNOSIS — M62838 Other muscle spasm: Secondary | ICD-10-CM

## 2019-10-03 DIAGNOSIS — R102 Pelvic and perineal pain unspecified side: Secondary | ICD-10-CM

## 2019-10-03 DIAGNOSIS — R278 Other lack of coordination: Secondary | ICD-10-CM | POA: Diagnosis present

## 2019-10-03 NOTE — Therapy (Signed)
[facility] ([facility]) [address] [place], [place], [address] Phone: [phone]   Fax:  [phone]  Physical Therapy Treatment  Patient Details  Name: Emily Walker MRN: [id number] Date of Birth: 04-18-1960 Referring Provider (PT): Denver Faster   Encounter Date: 10/03/2019   PT End of Session - 10/03/19 1704    Visit Number 2    Number of Visits 12    Date for PT Re-Evaluation 12/17/19    Authorization Type IE 09/24/2019    PT Start Time 1700    PT Stop Time 1755    PT Time Calculation (min) 55 min    Activity Tolerance Patient tolerated treatment well    Behavior During Therapy [facility] for tasks assessed/performed           Past Medical History:  Diagnosis Date  . Depression   . GERD (gastroesophageal reflux disease)   . Migraines     Past Surgical History:  Procedure Laterality Date  . ABDOMINAL HYSTERECTOMY    . CHOLECYSTECTOMY      There were no vitals filed for this visit.   Subjective Assessment - 10/03/19 1702    Subjective Patient notes that she has no pain today and notes no significant changes since last session. She also notes that her pain is somewhat intermittent and unpredictable.    Currently in Pain? No/denies           RANGE OF MOTION:    LEFT RIGHT  Lumbar forward flexion (65):  WNL*    Lumbar extension (30): WNL    Lumbar lateral flexion (25):  WNL* (on R) WNL  Thoracic and Lumbar rotation (30 degrees):    WNL WNL  Hip Flexion (0-125):   WNL WNL  Hip IR (0-45):  ~15  ~15  Hip ER (0-45):  WNL WNL  Hip Abduction (0-40):  WNL WNL  Hip extension (0-15):  ~5 ~5    SENSATION:  Grossly intact to light touch bilateral LEs as determined by testing dermatomes L2-S2 Proprioception and hot/cold testing deferred on this date  STRENGTH: MMT   RLE LLE  Hip Flexion 5 5  Hip Extension 5 5  Hip Abduction  5 5  Hip Adduction  5 5  Hip ER  5 5  Hip IR  5 5  Knee Extension 5 5  Knee Flexion 5 5   Dorsiflexion  5 5  Plantarflexion (seated) 5 5   ABDOMINAL:  Palpation: TTP over R and L psoas tendons; deep palpation of R psoas reproduced concordant pain Diastasis: rectus dominant with mild doming present, but no clear separation  EXTERNAL PELVIC EXAM:  Palpation: no TTP Breath coordination: present minimally Cued Lengthen: rectus abdominus strategy with negligible/no PFM activity Cued Contraction: 3-4/5 MMT with difficulty releasing mm after test Cough: no PFM movement  TREATMENT  Neuromuscular Re-education: Supine hooklying diaphragmatic breathing with VCs and TCs for downregulation of the nervous system and improved management of IAP Supine hooklying, PFM lengthening with inhalation. VCs and TCs to decrease compensatory patterns and encourage optimal relaxation of the PFM. Seated PFM lengthening with inhalation. VCs and TCs to decrease compensatory patterns and encourage optimal relaxation of the PFM. Standing R hip flexor/psoas stretch for decreased pain and tension and improved posture     Patient educated throughout session on appropriate technique and form using multi-modal cueing, HEP, and activity modification. Patient articulated understanding and returned demonstration.  Patient Response to interventions: 3-4/10 pain at R psoas with deep palpation  ASSESSMENT  Patient presents to clinic with excellent motivation to participate in therapy. Patient demonstrates deficits in PFM extensibility, PFM coordination, IAP management, nervous system down-regulation, posture, pain. Patient able to achieve palpable PFM lengthening with coordinated breath and visualization during today's session and responded positively to active and educational interventions. Patient will benefit from continued skilled therapeutic intervention to address remaining deficits in PFM extensibility, PFM coordination, IAP management, nervous system down-regulation, posture, pain in order to increase  function and improve overall QOL.     PT Long Term Goals - 09/25/19 1608      PT LONG TERM GOAL #1   Title Patient will demonstrate independence with HEP in order to maximize therapeutic gains and improve carryover from physical therapy sessions to ADLs in the home and community.    Baseline IE: not initiated    Time 12    Period Weeks    Status New    Target Date 12/17/19      PT LONG TERM GOAL #2   Title Patient will decrease worst pain as reported on NPRS by at least 2 points to demonstrate clinically significant reduction in pain in order to restore/improve function and overall QOL.    Baseline IE: 7/10    Time 12    Period Weeks    Status New    Target Date 12/17/19      PT LONG TERM GOAL #3   Title Patient will demonstrate improved function as evidenced by a score of 58 on FOTO Bowel Constipation measure for full participation in activities at home and in the community.    Baseline IE: 47    Time 12    Period Weeks    Status New    Target Date 12/17/19      PT LONG TERM GOAL #4   Title Patient will demonstrate improved function as evidenced by a score of 61 on FOTO Bowel Leakage measure for full participation in activities at home and in the community.    Baseline IE: 47    Time 12    Period Weeks    Status New    Target Date 12/17/19      PT LONG TERM GOAL #5   Title Patient will demonstrate understanding of basic self-management/down-regulation of the nervous system for persistent pain condition and stress as evidenced by diaphragmatic breathing without cueing, body scan/progressive relaxation meditation, and improved sleep hygiene in order to transition to independent management of patient's chief complaint: pelvic pain.    Baseline IE: not demonstrated    Time 12    Period Weeks    Status New    Target Date 12/17/19                 Plan - 10/03/19 1704    Clinical Impression Statement Patient presents to clinic with excellent motivation to participate  in therapy. Patient demonstrates deficits in PFM extensibility, PFM coordination, IAP management, nervous system down-regulation, posture, pain. Patient able to achieve palpable PFM lengthening with coordinated breath and visualization during today's session and responded positively to active and educational interventions. Patient will benefit from continued skilled therapeutic intervention to address remaining deficits in PFM extensibility, PFM coordination, IAP management, nervous system down-regulation, posture, pain in order to increase function and improve overall QOL.    Personal Factors and Comorbidities Age;Comorbidity 3+;Behavior Pattern;Fitness;Time since onset of injury/illness/exacerbation;Past/Current Experience    Comorbidities depression, GERD, OSA, OA, migraines    Examination-Activity Limitations Stand;Lift;Squat;Bend;Continence    Examination-Participation Restrictions Occupation;Other;Laundry;Cleaning;[employer] Work  Stability/Clinical Decision Making Evolving/Moderate complexity    Rehab Potential Fair    PT Frequency 1x / week    PT Duration 12 weeks    PT Treatment/Interventions ADLs/Self Care Home Management;Cryotherapy;Electrical Stimulation;Aquatic Therapy;Moist Heat;Gait training;Stair training;Therapeutic activities;Functional mobility training;Neuromuscular re-education;Balance training;Therapeutic exercise;Patient/family education;Manual techniques;Passive range of motion;Spinal Manipulations;Joint Manipulations;Dry needling;Taping;Scar mobilization    PT Next Visit Plan manual release of psoas; Sahrmann phase 1    PT Home Exercise Plan PFM lengthening, belly breathing, R psoas standing stretch    Consulted and Agree with Plan of Care Patient           Patient will benefit from skilled therapeutic intervention in order to improve the following deficits and impairments:  Decreased balance, Decreased endurance, Decreased mobility, Pain, Postural dysfunction, Improper body  mechanics, Impaired flexibility, Decreased strength, Decreased coordination, Decreased activity tolerance, Increased muscle spasms, Impaired tone, Hypomobility, Increased fascial restricitons  Visit Diagnosis: Pelvic pain  Other lack of coordination  Other muscle spasm     Problem List Patient Active Problem List   Diagnosis Date Noted  . Chest pain 05/19/2015  . Genetic testing 06/17/2014   Myles Gip PT, DPT [phone]  10/03/2019, 6:13 PM  [facility] [address] [place], [place], [address] Phone: [phone]   Fax:  [phone]  Name: Kinda Pottle MRN: [id number] Date of Birth: 1960-10-05

## 2019-10-08 ENCOUNTER — Ambulatory Visit: Payer: BC Managed Care – PPO | Admitting: Physical Therapy

## 2019-10-08 ENCOUNTER — Other Ambulatory Visit: Payer: Self-pay

## 2019-10-08 ENCOUNTER — Encounter: Payer: Self-pay | Admitting: Physical Therapy

## 2019-10-08 DIAGNOSIS — R278 Other lack of coordination: Secondary | ICD-10-CM

## 2019-10-08 DIAGNOSIS — R102 Pelvic and perineal pain unspecified side: Secondary | ICD-10-CM

## 2019-10-08 DIAGNOSIS — M62838 Other muscle spasm: Secondary | ICD-10-CM

## 2019-10-08 NOTE — Therapy (Signed)
[facility] [address] [place], [place], [address] Phone: [phone]   Fax:  [phone]  Physical Therapy Treatment  Patient Details  Name: Emily Walker MRN: [id number] Date of Birth: 1960-12-27 Referring Provider (PT): Denver Faster   Encounter Date: 10/08/2019   PT End of Session - 10/08/19 1405    Visit Number 3    Number of Visits 12    Date for PT Re-Evaluation 12/17/19    Authorization Type IE 09/24/2019    PT Start Time 1400    PT Stop Time 1455    PT Time Calculation (min) 55 min    Activity Tolerance Patient tolerated treatment well    Behavior During Therapy [facility] for tasks assessed/performed           Past Medical History:  Diagnosis Date  . Depression   . GERD (gastroesophageal reflux disease)   . Migraines     Past Surgical History:  Procedure Laterality Date  . ABDOMINAL HYSTERECTOMY    . CHOLECYSTECTOMY      There were no vitals filed for this visit.   Subjective Assessment - 10/08/19 1401    Subjective Patient notes that she feels as though her R psoas relaxed with belly breathing. Patient does add that after her last session she had some right sided midback pain which feels like tightness (5/10).    Currently in Pain? Yes    Pain Score 5     Pain Location Back    Pain Orientation Mid;Right           TREATMENT Manual Therapy: STM and TPR performed to R hip flexor complex, R lower abdominals to allow for decreased tension and pain and improved posture and function Colon massage performed and explained for improved bowel regularity and decreased RLQ discomfort  Neuromuscular Re-education: Supine hooklying diaphragmatic breathing with VCs and TCs for downregulation of the nervous system and improved management of IAP Supine R hip flexor/psoas stretch for decreased pain/spasm Supine R hip flexor/psoas stretch with posterior pelvic tilt for decreased pain/spasm Supine hooklying, TrA activation  with exhalation; VCs and TCs to decrease compensatory patterns and minimize aggravation of the lumbar paraspinals.    Patient educated throughout session on appropriate technique and form using multi-modal cueing, HEP, and activity modification. Patient articulated understanding and returned demonstration.  Patient Response to interventions: 1/10 pain at R psoas with deep palpation  ASSESSMENT Patient presents to clinic with excellent motivation to participate in therapy. Patient demonstrates deficits in PFM extensibility, PFM coordination, IAP management, nervous system down-regulation, posture, pain. Patient able to perform supine TrA activation with coordinated breath during today's session and responded positively to active and manual interventions. Patient will benefit from continued skilled therapeutic intervention to address remaining deficits in PFM extensibility, PFM coordination, IAP management, nervous system down-regulation, posture, pain in order to increase function and improve overall QOL.       PT Long Term Goals - 09/25/19 1608      PT LONG TERM GOAL #1   Title Patient will demonstrate independence with HEP in order to maximize therapeutic gains and improve carryover from physical therapy sessions to ADLs in the home and community.    Baseline IE: not initiated    Time 12    Period Weeks    Status New    Target Date 12/17/19      PT LONG TERM GOAL #2   Title Patient will decrease worst pain as reported on NPRS by at least  2 points to demonstrate clinically significant reduction in pain in order to restore/improve function and overall QOL.    Baseline IE: 7/10    Time 12    Period Weeks    Status New    Target Date 12/17/19      PT LONG TERM GOAL #3   Title Patient will demonstrate improved function as evidenced by a score of 58 on FOTO Bowel Constipation measure for full participation in activities at home and in the community.    Baseline IE: 47    Time 12     Period Weeks    Status New    Target Date 12/17/19      PT LONG TERM GOAL #4   Title Patient will demonstrate improved function as evidenced by a score of 61 on FOTO Bowel Leakage measure for full participation in activities at home and in the community.    Baseline IE: 47    Time 12    Period Weeks    Status New    Target Date 12/17/19      PT LONG TERM GOAL #5   Title Patient will demonstrate understanding of basic self-management/down-regulation of the nervous system for persistent pain condition and stress as evidenced by diaphragmatic breathing without cueing, body scan/progressive relaxation meditation, and improved sleep hygiene in order to transition to independent management of patient's chief complaint: pelvic pain.    Baseline IE: not demonstrated    Time 12    Period Weeks    Status New    Target Date 12/17/19                 Plan - 10/08/19 1406    Clinical Impression Statement Patient presents to clinic with excellent motivation to participate in therapy. Patient demonstrates deficits in PFM extensibility, PFM coordination, IAP management, nervous system down-regulation, posture, pain. Patient able to perform supine TrA activation with coordinated breath during today's session and responded positively to active and manual interventions. Patient will benefit from continued skilled therapeutic intervention to address remaining deficits in PFM extensibility, PFM coordination, IAP management, nervous system down-regulation, posture, pain in order to increase function and improve overall QOL.    Personal Factors and Comorbidities Age;Comorbidity 3+;Behavior Pattern;Fitness;Time since onset of injury/illness/exacerbation;Past/Current Experience    Comorbidities depression, GERD, OSA, OA, migraines    Examination-Activity Limitations Stand;Lift;Squat;Bend;Continence    Examination-Participation Restrictions Occupation;Other;Laundry;Cleaning;Yard Work    Copy Evolving/Moderate complexity    Rehab Potential Fair    PT Frequency 1x / week    PT Duration 12 weeks    PT Treatment/Interventions ADLs/Self Care Home Management;Cryotherapy;Electrical Stimulation;Aquatic Therapy;Moist Heat;Gait training;Stair training;Therapeutic activities;Functional mobility training;Neuromuscular re-education;Balance training;Therapeutic exercise;Patient/family education;Manual techniques;Passive range of motion;Spinal Manipulations;Joint Manipulations;Dry needling;Taping;Scar mobilization    PT Next Visit Plan manual release of psoas; Sahrmann phase 1    PT Home Exercise Plan PFM lengthening, belly breathing, R psoas standing stretch    Consulted and Agree with Plan of Care Patient           Patient will benefit from skilled therapeutic intervention in order to improve the following deficits and impairments:  Decreased balance, Decreased endurance, Decreased mobility, Pain, Postural dysfunction, Improper body mechanics, Impaired flexibility, Decreased strength, Decreased coordination, Decreased activity tolerance, Increased muscle spasms, Impaired tone, Hypomobility, Increased fascial restricitons  Visit Diagnosis: Pelvic pain  Other lack of coordination  Other muscle spasm     Problem List Patient Active Problem List   Diagnosis Date Noted  . Chest pain 05/19/2015  .  Genetic testing 06/17/2014   Myles Gip PT, DPT [phone]  10/08/2019, 6:05 PM  [facility] [address] [place], [place], [address] Phone: [phone]   Fax:  [phone]  Name: Emily Walker MRN: [id number] Date of Birth: 04-10-1960

## 2019-10-10 ENCOUNTER — Encounter: Payer: BC Managed Care – PPO | Admitting: Physical Therapy

## 2019-10-15 ENCOUNTER — Ambulatory Visit: Payer: BC Managed Care – PPO | Admitting: Physical Therapy

## 2019-10-17 ENCOUNTER — Ambulatory Visit: Payer: BC Managed Care – PPO | Admitting: Physical Therapy

## 2019-10-22 ENCOUNTER — Ambulatory Visit: Payer: BC Managed Care – PPO | Admitting: Physical Therapy

## 2019-10-24 ENCOUNTER — Encounter: Payer: Self-pay | Admitting: Physical Therapy

## 2019-10-24 ENCOUNTER — Ambulatory Visit: Payer: BC Managed Care – PPO | Admitting: Physical Therapy

## 2019-10-24 ENCOUNTER — Other Ambulatory Visit: Payer: Self-pay

## 2019-10-24 DIAGNOSIS — R102 Pelvic and perineal pain: Secondary | ICD-10-CM

## 2019-10-24 DIAGNOSIS — M62838 Other muscle spasm: Secondary | ICD-10-CM

## 2019-10-24 DIAGNOSIS — R278 Other lack of coordination: Secondary | ICD-10-CM

## 2019-10-24 NOTE — Therapy (Signed)
[facility] [address] [place], [place], [address] Phone: [phone]   Fax:  [phone]  Physical Therapy Treatment  Patient Details  Name: Emily Walker MRN: [id number] Date of Birth: 04/26/1960 Referring Provider (PT): Denver Faster   Encounter Date: 10/24/2019   PT End of Session - 10/24/19 1117    Visit Number 4    Number of Visits 12    Date for PT Re-Evaluation 12/17/19    Authorization Type IE 09/24/2019    PT Start Time 1100    PT Stop Time 1155    PT Time Calculation (min) 55 min    Activity Tolerance Patient tolerated treatment well    Behavior During Therapy [facility] for tasks assessed/performed           Past Medical History:  Diagnosis Date  . Depression   . GERD (gastroesophageal reflux disease)   . Migraines     Past Surgical History:  Procedure Laterality Date  . ABDOMINAL HYSTERECTOMY    . CHOLECYSTECTOMY      There were no vitals filed for this visit.   Subjective Assessment - 10/24/19 1108    Subjective Patient reports that since her last visit she sprained her L ankle when her leg went numb. Patient notes that she is occasionally (1-2x/week) sitting for > 20 min to attempt to have a BM. Patient adds that she at times, when not constipated, has difficulty initiating a BM.    Currently in Pain? Yes    Pain Score 2     Pain Location Ankle    Pain Orientation Left          TREATMENT  Neuromuscular Re-education: Patient education and demonstration (where appropriate):  Toileting posture  Hip IR for PFM stretch  Toileting Meditation  Toileting Techniques  Bowel Retraining Strategies (regular timed voiding; bowel massage; gentle exercise; warm beverage; consistent meal sizes)  Genital self-exploration   Patient educated throughout session on appropriate technique and form using multi-modal cueing, HEP, and activity modification. Patient articulated understanding and returned  demonstration.  Patient Response to interventions: Patient committing to toileting posture for this week.  ASSESSMENT Patient presents to clinic with excellent motivation to participate in therapy. Patient demonstrates deficits in PFM extensibility, PFM coordination, IAP management, nervous system down-regulation, posture, pain. Patient articulating understanding for bowel emptying strategies during today's session and responded positively to educational interventions. Patient will benefit from continued skilled therapeutic intervention to address remaining deficits in PFM extensibility, PFM coordination, IAP management, nervous system down-regulation, posture, pain in order to increase function and improve overall QOL.     PT Long Term Goals - 09/25/19 1608      PT LONG TERM GOAL #1   Title Patient will demonstrate independence with HEP in order to maximize therapeutic gains and improve carryover from physical therapy sessions to ADLs in the home and community.    Baseline IE: not initiated    Time 12    Period Weeks    Status New    Target Date 12/17/19      PT LONG TERM GOAL #2   Title Patient will decrease worst pain as reported on NPRS by at least 2 points to demonstrate clinically significant reduction in pain in order to restore/improve function and overall QOL.    Baseline IE: 7/10    Time 12    Period Weeks    Status New    Target Date 12/17/19      PT LONG TERM  GOAL #3   Title Patient will demonstrate improved function as evidenced by a score of 58 on FOTO Bowel Constipation measure for full participation in activities at home and in the community.    Baseline IE: 47    Time 12    Period Weeks    Status New    Target Date 12/17/19      PT LONG TERM GOAL #4   Title Patient will demonstrate improved function as evidenced by a score of 61 on FOTO Bowel Leakage measure for full participation in activities at home and in the community.    Baseline IE: 47    Time 12     Period Weeks    Status New    Target Date 12/17/19      PT LONG TERM GOAL #5   Title Patient will demonstrate understanding of basic self-management/down-regulation of the nervous system for persistent pain condition and stress as evidenced by diaphragmatic breathing without cueing, body scan/progressive relaxation meditation, and improved sleep hygiene in order to transition to independent management of patient's chief complaint: pelvic pain.    Baseline IE: not demonstrated    Time 12    Period Weeks    Status New    Target Date 12/17/19                 Plan - 10/24/19 1119    Clinical Impression Statement Patient presents to clinic with excellent motivation to participate in therapy. Patient demonstrates deficits in PFM extensibility, PFM coordination, IAP management, nervous system down-regulation, posture, pain. Patient articulating understanding for bowel emptying strategies during today's session and responded positively to educational interventions. Patient will benefit from continued skilled therapeutic intervention to address remaining deficits in PFM extensibility, PFM coordination, IAP management, nervous system down-regulation, posture, pain in order to increase function and improve overall QOL.    Personal Factors and Comorbidities Age;Comorbidity 3+;Behavior Pattern;Fitness;Time since onset of injury/illness/exacerbation;Past/Current Experience    Comorbidities depression, GERD, OSA, OA, migraines    Examination-Activity Limitations Stand;Lift;Squat;Bend;Continence    Examination-Participation Restrictions Occupation;Other;Laundry;Cleaning;Yard Work    [employer] Evolving/Moderate complexity    Rehab Potential Fair    PT Frequency 1x / week    PT Duration 12 weeks    PT Treatment/Interventions ADLs/Self Care Home Management;Cryotherapy;Electrical Stimulation;Aquatic Therapy;Moist Heat;Gait training;Stair training;Therapeutic activities;Functional  mobility training;Neuromuscular re-education;Balance training;Therapeutic exercise;Patient/family education;Manual techniques;Passive range of motion;Spinal Manipulations;Joint Manipulations;Dry needling;Taping;Scar mobilization    PT Next Visit Plan manual release of psoas; Sahrmann phase 1    PT Home Exercise Plan PFM lengthening, belly breathing, R psoas standing stretch    Consulted and Agree with Plan of Care Patient           Patient will benefit from skilled therapeutic intervention in order to improve the following deficits and impairments:  Decreased balance, Decreased endurance, Decreased mobility, Pain, Postural dysfunction, Improper body mechanics, Impaired flexibility, Decreased strength, Decreased coordination, Decreased activity tolerance, Increased muscle spasms, Impaired tone, Hypomobility, Increased fascial restricitons  Visit Diagnosis: Pelvic pain  Other lack of coordination  Other muscle spasm     Problem List Patient Active Problem List   Diagnosis Date Noted  . Chest pain 05/19/2015  . Genetic testing 06/17/2014   Myles Gip PT, DPT [phone]  10/24/2019, 1:05 PM  [facility] [address] [place], [place], [address] Phone: [phone]   Fax:  [phone]  Name: Emily Walker MRN: [id number] Date of Birth: 15-Nov-1960

## 2019-10-29 ENCOUNTER — Ambulatory Visit: Payer: BC Managed Care – PPO | Admitting: Physical Therapy

## 2019-10-31 ENCOUNTER — Ambulatory Visit: Payer: BC Managed Care – PPO | Admitting: Physical Therapy

## 2019-10-31 ENCOUNTER — Encounter: Payer: Self-pay | Admitting: Physical Therapy

## 2019-10-31 ENCOUNTER — Other Ambulatory Visit: Payer: Self-pay

## 2019-10-31 DIAGNOSIS — M62838 Other muscle spasm: Secondary | ICD-10-CM

## 2019-10-31 DIAGNOSIS — R102 Pelvic and perineal pain: Secondary | ICD-10-CM

## 2019-10-31 DIAGNOSIS — R278 Other lack of coordination: Secondary | ICD-10-CM

## 2019-10-31 NOTE — Therapy (Signed)
[facility] [address] [facility] [address], [place], [address] Phone: [phone]   Fax:  [phone]  Physical Therapy Treatment  Patient Details  Name: Emily Walker MRN: [id number] Date of Birth: 02-11-1960 Referring Provider (PT): Denver Faster   Encounter Date: 10/31/2019   PT End of Session - 10/31/19 1113    Visit Number 5    Number of Visits 12    Date for PT Re-Evaluation 12/17/19    Authorization Type IE 09/24/2019    PT Start Time 1103    PT Stop Time 1200    PT Time Calculation (min) 57 min    Activity Tolerance Patient tolerated treatment well    Behavior During Therapy [facility] for tasks assessed/performed           Past Medical History:  Diagnosis Date  . Depression   . GERD (gastroesophageal reflux disease)   . Migraines     Past Surgical History:  Procedure Laterality Date  . ABDOMINAL HYSTERECTOMY    . CHOLECYSTECTOMY      There were no vitals filed for this visit.   Subjective Assessment - 10/31/19 1105    Subjective Patient notes that her L ankle continues to heal and is doing better. She reports 0/10 pain at that site. Patient notes that she made adjustments to toileting posture helped to some degree with both urination and BMs. Patient notes she continues to work on pelvic floor muscle awareness but is 40% confident with PFM lengthening due to limited PFM awareness. Patient notes she has started to notice when she is stressed because of overall bodily tension; she has been able to use dipahragmatic breathing to manage some of this.    Currently in Pain? No/denies           TREATMENT  Neuromuscular Re-education: Patient education on strategies to improve PFM proprioception/awareness. Supine knee to chest with PFM lengthening, BLE, for improved PFM spasm release Supine double knee to chest with PFM lengthening, BLE, for improved PFM spasm release Supine happy baby with PFM lengthening, BLE, for improved PFM  spasm release Supine butterfly with PFM lengthening, BLE, for improved PFM spasm release   Patient educated throughout session on appropriate technique and form using multi-modal cueing, HEP, and activity modification. Patient articulated understanding and returned demonstration.  Patient Response to interventions: Patient committing to single knee to chest PFM stretch this week. Mild discomfort at RLQ.  ASSESSMENT Patient presents to clinic with excellent motivation to participate in therapy. Patient demonstrates deficits in PFM extensibility, PFM coordination, IAP management, nervous system down-regulation, posture, pain. Patient better able to sense PFM relaxation/lengthening with added LE stretch during today's session and responded positively to educational interventions. Patient will benefit from continued skilled therapeutic intervention to address remaining deficits in PFM extensibility, PFM coordination, IAP management, nervous system down-regulation, posture, pain in order to increase function and improve overall QOL.     PT Long Term Goals - 09/25/19 1608      PT LONG TERM GOAL #1   Title Patient will demonstrate independence with HEP in order to maximize therapeutic gains and improve carryover from physical therapy sessions to ADLs in the home and community.    Baseline IE: not initiated    Time 12    Period Weeks    Status New    Target Date 12/17/19      PT LONG TERM GOAL #2   Title Patient will decrease worst pain as reported on NPRS by at least  2 points to demonstrate clinically significant reduction in pain in order to restore/improve function and overall QOL.    Baseline IE: 7/10    Time 12    Period Weeks    Status New    Target Date 12/17/19      PT LONG TERM GOAL #3   Title Patient will demonstrate improved function as evidenced by a score of 58 on FOTO Bowel Constipation measure for full participation in activities at home and in the community.    Baseline IE:  47    Time 12    Period Weeks    Status New    Target Date 12/17/19      PT LONG TERM GOAL #4   Title Patient will demonstrate improved function as evidenced by a score of 61 on FOTO Bowel Leakage measure for full participation in activities at home and in the community.    Baseline IE: 47    Time 12    Period Weeks    Status New    Target Date 12/17/19      PT LONG TERM GOAL #5   Title Patient will demonstrate understanding of basic self-management/down-regulation of the nervous system for persistent pain condition and stress as evidenced by diaphragmatic breathing without cueing, body scan/progressive relaxation meditation, and improved sleep hygiene in order to transition to independent management of patient's chief complaint: pelvic pain.    Baseline IE: not demonstrated    Time 12    Period Weeks    Status New    Target Date 12/17/19                 Plan - 10/31/19 1113    Clinical Impression Statement Patient presents to clinic with excellent motivation to participate in therapy. Patient demonstrates deficits in PFM extensibility, PFM coordination, IAP management, nervous system down-regulation, posture, pain. Patient better able to sense PFM relaxation/lengthening with added LE stretch during today's session and responded positively to educational interventions. Patient will benefit from continued skilled therapeutic intervention to address remaining deficits in PFM extensibility, PFM coordination, IAP management, nervous system down-regulation, posture, pain in order to increase function and improve overall QOL.    Personal Factors and Comorbidities Age;Comorbidity 3+;Behavior Pattern;Fitness;Time since onset of injury/illness/exacerbation;Past/Current Experience    Comorbidities depression, GERD, OSA, OA, migraines    Examination-Activity Limitations Stand;Lift;Squat;Bend;Continence    Examination-Participation Restrictions Occupation;Other;Laundry;Cleaning;Yard Work     [employer] Evolving/Moderate complexity    Rehab Potential Fair    PT Frequency 1x / week    PT Duration 12 weeks    PT Treatment/Interventions ADLs/Self Care Home Management;Cryotherapy;Electrical Stimulation;Aquatic Therapy;Moist Heat;Gait training;Stair training;Therapeutic activities;Functional mobility training;Neuromuscular re-education;Balance training;Therapeutic exercise;Patient/family education;Manual techniques;Passive range of motion;Spinal Manipulations;Joint Manipulations;Dry needling;Taping;Scar mobilization    PT Next Visit Plan manual release of psoas; Sahrmann phase 1    PT Home Exercise Plan PFM lengthening, belly breathing, R psoas standing stretch    Consulted and Agree with Plan of Care Patient           Patient will benefit from skilled therapeutic intervention in order to improve the following deficits and impairments:  Decreased balance, Decreased endurance, Decreased mobility, Pain, Postural dysfunction, Improper body mechanics, Impaired flexibility, Decreased strength, Decreased coordination, Decreased activity tolerance, Increased muscle spasms, Impaired tone, Hypomobility, Increased fascial restricitons  Visit Diagnosis: Pelvic pain  Other lack of coordination  Other muscle spasm     Problem List Patient Active Problem List   Diagnosis Date Noted  . Chest pain 05/19/2015  .  Genetic testing 06/17/2014   Myles Gip PT, DPT [phone]  10/31/2019, 1:05 PM  [facility] [address] [place], [place], [address] Phone: [phone]   Fax:  [phone]  Name: Emily Walker MRN: [id number] Date of Birth: 25-Feb-1960

## 2019-11-05 ENCOUNTER — Ambulatory Visit: Payer: BC Managed Care – PPO | Admitting: Physical Therapy

## 2019-11-07 ENCOUNTER — Other Ambulatory Visit: Payer: Self-pay

## 2019-11-07 ENCOUNTER — Encounter: Payer: Self-pay | Admitting: Physical Therapy

## 2019-11-07 ENCOUNTER — Ambulatory Visit: Payer: BC Managed Care – PPO | Attending: Obstetrics and Gynecology | Admitting: Physical Therapy

## 2019-11-07 DIAGNOSIS — M62838 Other muscle spasm: Secondary | ICD-10-CM | POA: Insufficient documentation

## 2019-11-07 DIAGNOSIS — R102 Pelvic and perineal pain: Secondary | ICD-10-CM | POA: Insufficient documentation

## 2019-11-07 DIAGNOSIS — R278 Other lack of coordination: Secondary | ICD-10-CM | POA: Insufficient documentation

## 2019-11-07 NOTE — Therapy (Signed)
[facility] [address] [place], [place], [address] Phone: [phone]   Fax:  [phone]  Physical Therapy Treatment  Patient Details  Name: Emily Walker MRN: [id number] Date of Birth: 21-Mar-1960 Referring Provider (PT): Denver Faster   Encounter Date: 11/07/2019   PT End of Session - 11/07/19 1541    Visit Number 6    Number of Visits 12    Date for PT Re-Evaluation 12/17/19    Authorization Type IE 09/24/2019    PT Start Time 1104    PT Stop Time 1204    PT Time Calculation (min) 60 min    Activity Tolerance Patient tolerated treatment well    Behavior During Therapy [facility] for tasks assessed/performed           Past Medical History:  Diagnosis Date   Depression    GERD (gastroesophageal reflux disease)    Migraines     Past Surgical History:  Procedure Laterality Date   ABDOMINAL HYSTERECTOMY     CHOLECYSTECTOMY      There were no vitals filed for this visit.   Subjective Assessment - 11/07/19 1540    Subjective Patient notes that she has had an eventful and stressful week with episode of afib and follow-up cardiology appointment. Patient notes tension in her back and R psoas/LQ.           TREATMENT Manual Therapy: STM and TPR performed to thoracolumbar region to allow for decreased tension and pain and improved posture and function with vibratory and percussive device STM and TPR performed to R psoas to allow for decreased tension and pain and improved posture and function with and without vibratory and percussive device  Neuromuscular Re-education: Patient education on   Strategies for stress management  Chunking goals for decreased overwhelm  Vagus nerve stimulation tactics for PNS stimulation     Patient educated throughout session on appropriate technique and form using multi-modal cueing, HEP, and activity modification. Patient articulated understanding and returned demonstration.  Patient  Response to interventions: Patient notes decreased pain/tension at RLQ  ASSESSMENT Patient presents to clinic with excellent motivation to participate in therapy. Patient demonstrates deficits in PFM extensibility, PFM coordination, IAP management, nervous system down-regulation, posture, pain. Patient tolerating R psoas release well during today's session and responded positively to educational interventions. Patient will benefit from continued skilled therapeutic intervention to address remaining deficits in PFM extensibility, PFM coordination, IAP management, nervous system down-regulation, posture, pain in order to increase function and improve overall QOL.     PT Long Term Goals - 09/25/19 1608      PT LONG TERM GOAL #1   Title Patient will demonstrate independence with HEP in order to maximize therapeutic gains and improve carryover from physical therapy sessions to ADLs in the home and community.    Baseline IE: not initiated    Time 12    Period Weeks    Status New    Target Date 12/17/19      PT LONG TERM GOAL #2   Title Patient will decrease worst pain as reported on NPRS by at least 2 points to demonstrate clinically significant reduction in pain in order to restore/improve function and overall QOL.    Baseline IE: 7/10    Time 12    Period Weeks    Status New    Target Date 12/17/19      PT LONG TERM GOAL #3   Title Patient will demonstrate improved function as evidenced  by a score of 58 on FOTO Bowel Constipation measure for full participation in activities at home and in the community.    Baseline IE: 47    Time 12    Period Weeks    Status New    Target Date 12/17/19      PT LONG TERM GOAL #4   Title Patient will demonstrate improved function as evidenced by a score of 61 on FOTO Bowel Leakage measure for full participation in activities at home and in the community.    Baseline IE: 47    Time 12    Period Weeks    Status New    Target Date 12/17/19      PT  LONG TERM GOAL #5   Title Patient will demonstrate understanding of basic self-management/down-regulation of the nervous system for persistent pain condition and stress as evidenced by diaphragmatic breathing without cueing, body scan/progressive relaxation meditation, and improved sleep hygiene in order to transition to independent management of patient's chief complaint: pelvic pain.    Baseline IE: not demonstrated    Time 12    Period Weeks    Status New    Target Date 12/17/19                 Plan - 11/07/19 1543    Clinical Impression Statement Patient presents to clinic with excellent motivation to participate in therapy. Patient demonstrates deficits in PFM extensibility, PFM coordination, IAP management, nervous system down-regulation, posture, pain. Patient tolerating R psoas release well during today's session and responded positively to educational interventions. Patient will benefit from continued skilled therapeutic intervention to address remaining deficits in PFM extensibility, PFM coordination, IAP management, nervous system down-regulation, posture, pain in order to increase function and improve overall QOL.    Personal Factors and Comorbidities Age;Comorbidity 3+;Behavior Pattern;Fitness;Time since onset of injury/illness/exacerbation;Past/Current Experience    Comorbidities depression, GERD, OSA, OA, migraines    Examination-Activity Limitations Stand;Lift;Squat;Bend;Continence    Examination-Participation Restrictions Occupation;Other;Laundry;Cleaning;Yard Work    [employer] officer Evolving/Moderate complexity    Rehab Potential Fair    PT Frequency 1x / week    PT Duration 12 weeks    PT Treatment/Interventions ADLs/Self Care Home Management;Cryotherapy;Electrical Stimulation;Aquatic Therapy;Moist Heat;Gait training;Stair training;Therapeutic activities;Functional mobility training;Neuromuscular re-education;Balance training;Therapeutic  exercise;Patient/family education;Manual techniques;Passive range of motion;Spinal Manipulations;Joint Manipulations;Dry needling;Taping;Scar mobilization    PT Next Visit Plan manual release of psoas; Sahrmann phase 1    PT Home Exercise Plan PFM lengthening, belly breathing, R psoas standing stretch    Consulted and Agree with Plan of Care Patient           Patient will benefit from skilled therapeutic intervention in order to improve the following deficits and impairments:  Decreased balance, Decreased endurance, Decreased mobility, Pain, Postural dysfunction, Improper body mechanics, Impaired flexibility, Decreased strength, Decreased coordination, Decreased activity tolerance, Increased muscle spasms, Impaired tone, Hypomobility, Increased fascial restricitons  Visit Diagnosis: Pelvic pain  Other lack of coordination  Other muscle spasm     Problem List Patient Active Problem List   Diagnosis Date Noted   Chest pain 05/19/2015   Genetic testing 06/17/2014   Myles Gip PT, DPT [phone]  11/07/2019, 3:50 PM  [facility] [address] [place], [place], [address] Phone: [phone]   Fax:  [phone]  Name: Emily Walker MRN: [id number] Date of Birth: [birth year]

## 2019-11-14 ENCOUNTER — Ambulatory Visit: Payer: BC Managed Care – PPO | Admitting: Physical Therapy

## 2019-11-21 ENCOUNTER — Ambulatory Visit: Payer: BC Managed Care – PPO | Admitting: Physical Therapy

## 2019-11-28 ENCOUNTER — Ambulatory Visit: Payer: BC Managed Care – PPO | Admitting: Physical Therapy

## 2020-06-16 ENCOUNTER — Encounter: Payer: Self-pay | Admitting: Internal Medicine

## 2020-06-17 ENCOUNTER — Other Ambulatory Visit: Payer: Self-pay

## 2020-06-17 ENCOUNTER — Encounter: Payer: Self-pay | Admitting: Internal Medicine

## 2020-06-17 ENCOUNTER — Ambulatory Visit
Admission: RE | Admit: 2020-06-17 | Discharge: 2020-06-17 | Disposition: A | Discharge status: Home / Self Care | Payer: BC Managed Care – PPO | Attending: Internal Medicine | Admitting: Internal Medicine

## 2020-06-17 ENCOUNTER — Ambulatory Visit: Payer: BC Managed Care – PPO | Admitting: Certified Registered"

## 2020-06-17 ENCOUNTER — Encounter
Admission: RE | Disposition: A | Discharge status: Home / Self Care | Payer: Self-pay | Source: Home / Self Care | Attending: Internal Medicine

## 2020-06-17 DIAGNOSIS — K219 Gastro-esophageal reflux disease without esophagitis: Secondary | ICD-10-CM | POA: Insufficient documentation

## 2020-06-17 DIAGNOSIS — Z7901 Long term (current) use of anticoagulants: Secondary | ICD-10-CM | POA: Diagnosis not present

## 2020-06-17 DIAGNOSIS — Z881 Allergy status to other antibiotic agents status: Secondary | ICD-10-CM | POA: Diagnosis not present

## 2020-06-17 DIAGNOSIS — Z1211 Encounter for screening for malignant neoplasm of colon: Secondary | ICD-10-CM | POA: Diagnosis not present

## 2020-06-17 DIAGNOSIS — Z86010 Personal history of colonic polyps: Secondary | ICD-10-CM | POA: Insufficient documentation

## 2020-06-17 DIAGNOSIS — Z886 Allergy status to analgesic agent status: Secondary | ICD-10-CM | POA: Insufficient documentation

## 2020-06-17 DIAGNOSIS — Z791 Long term (current) use of non-steroidal anti-inflammatories (NSAID): Secondary | ICD-10-CM | POA: Insufficient documentation

## 2020-06-17 DIAGNOSIS — Z79899 Other long term (current) drug therapy: Secondary | ICD-10-CM | POA: Insufficient documentation

## 2020-06-17 DIAGNOSIS — K640 First degree hemorrhoids: Secondary | ICD-10-CM | POA: Diagnosis not present

## 2020-06-17 DIAGNOSIS — I10 Essential (primary) hypertension: Secondary | ICD-10-CM | POA: Insufficient documentation

## 2020-06-17 HISTORY — DX: Benign neoplasm of connective and other soft tissue, unspecified: D21.9

## 2020-06-17 HISTORY — DX: Endometriosis of the uterus, unspecified: N80.00

## 2020-06-17 HISTORY — DX: Slow transit constipation: K59.01

## 2020-06-17 HISTORY — DX: Anxiety disorder, unspecified: F41.9

## 2020-06-17 HISTORY — PX: COLONOSCOPY WITH PROPOFOL: SHX5780

## 2020-06-17 HISTORY — DX: Unspecified osteoarthritis, unspecified site: M19.90

## 2020-06-17 HISTORY — DX: Dysmenorrhea, unspecified: N94.6

## 2020-06-17 HISTORY — DX: Irritable bowel syndrome without diarrhea: K58.9

## 2020-06-17 HISTORY — DX: Endometriosis of uterus: N80.0

## 2020-06-17 HISTORY — DX: Essential (primary) hypertension: I10

## 2020-06-17 SURGERY — COLONOSCOPY WITH PROPOFOL
Anesthesia: General

## 2020-06-17 MED ORDER — MIDAZOLAM HCL 2 MG/2ML IJ SOLN
INTRAMUSCULAR | Status: AC
  Filled 2020-06-17: qty 2

## 2020-06-17 MED ORDER — SODIUM CHLORIDE 0.9 % IV SOLN: INTRAVENOUS | Status: DC

## 2020-06-17 MED ORDER — MIDAZOLAM HCL 2 MG/2ML IJ SOLN
INTRAMUSCULAR | Status: DC | PRN
  Administered 2020-06-17: 2 mg via INTRAVENOUS

## 2020-06-17 MED ORDER — LIDOCAINE HCL (CARDIAC) PF 100 MG/5ML IV SOSY
PREFILLED_SYRINGE | INTRAVENOUS | Status: DC | PRN
  Administered 2020-06-17: 100 mg via INTRAVENOUS

## 2020-06-17 MED ORDER — PROPOFOL 10 MG/ML IV BOLUS
INTRAVENOUS | Status: DC | PRN
  Administered 2020-06-17: 60 mg via INTRAVENOUS
  Administered 2020-06-17: 20 mg via INTRAVENOUS

## 2020-06-17 MED ORDER — PROPOFOL 500 MG/50ML IV EMUL
INTRAVENOUS | Status: DC | PRN
  Administered 2020-06-17: 155 ug/kg/min via INTRAVENOUS

## 2020-06-17 NOTE — H&P (Signed)
Outpatient short stay form Pre-procedure 06/17/2020 8:43 AM Emily Walker K. [name], M.D.  Primary Physician: Frazier Richards, M.D.  Reason for visit:  Personal history of adenomatous colon polyp  History of present illness:                            Patient presents for colonoscopy for a personal hx of colon polyps. The patient denies abdominal pain, abnormal weight loss or rectal bleeding.      Current Facility-Administered Medications:  .  0.9 %  sodium chloride infusion, , Intravenous, Continuous, [place], Benay Pike, MD, Last Rate: 20 mL/hr at 06/17/20 0744, New Bag at 06/17/20 0744  Medications Prior to Admission  Medication Sig Dispense Refill Last Dose  . apixaban (ELIQUIS) 5 MG TABS tablet Take 5 mg by mouth 2 (two) times daily.   06/14/2020  . buPROPion (WELLBUTRIN) 100 MG tablet Take 100 mg by mouth 2 (two) times daily.   Past Week at Unknown time  . cholecalciferol (VITAMIN D3) 10 MCG (400 UNIT) TABS tablet Take 2,000 Units by mouth.   Past Week at Unknown time  . metoprolol succinate (TOPROL-XL) 50 MG 24 hr tablet Take 100 mg by mouth daily. Take with or immediately following a meal.   06/17/2020 at 0600  . RABEprazole (ACIPHEX) 20 MG tablet Take 20 mg by mouth daily.   12 Past Week at Unknown time  . sertraline (ZOLOFT) 100 MG tablet Take 150 mg by mouth daily.   Past Week at Unknown time  . vitamin E 1000 UNIT capsule Take 2,000 Units by mouth daily.   Past Week at Unknown time  . acetaminophen (TYLENOL) 500 MG tablet Take 500 mg by mouth daily.     . diphenhydrAMINE (BENADRYL) 25 MG tablet Take 50 mg by mouth at bedtime. (Patient not taking: Reported on 09/24/2019)     . docusate sodium (COLACE) 100 MG capsule Take 1 tablet once or twice daily as needed for constipation while taking narcotic pain medicine (Patient not taking: Reported on 09/22/2015) 30 capsule 0   . HYDROcodone-acetaminophen (NORCO/VICODIN) 5-325 MG tablet Take 1 tablet by mouth every 4 (four) hours as needed. 15  tablet 0   . ibuprofen (ADVIL,MOTRIN) 600 MG tablet Take 600 mg by mouth daily. (Patient not taking: Reported on 09/24/2019)     . MELATONIN GUMMIES PO Take 1 each by mouth at bedtime.  (Patient not taking: Reported on 09/24/2019)     . ondansetron (ZOFRAN ODT) 4 MG disintegrating tablet Take 1 tablet (4 mg total) by mouth every 8 (eight) hours as needed for nausea or vomiting. (Patient not taking: Reported on 09/24/2019) 20 tablet 0   . oxyCODONE-acetaminophen (ROXICET) 5-325 MG tablet Take 1-2 tablets by mouth every 4 (four) hours as needed for severe pain. (Patient not taking: Reported on 04/25/2017) 30 tablet 0   . tamsulosin (FLOMAX) 0.4 MG CAPS capsule Take 1 capsule (0.4 mg total) by mouth daily. (Patient not taking: Reported on 04/25/2017) 30 capsule 0      Allergies  Allergen Reactions  . Flagyl [Metronidazole] Nausea And Vomiting  . Nsaids Other (See Comments)    To avoid while taking Eliquis     Past Medical History:  Diagnosis Date  . Anxiety   . Arthritis   . Constipation due to slow transit   . Depression   . Dysmenorrhea   . Endometriosis of uterus   . Fibroid   . GERD (gastroesophageal reflux disease)   .  Hypertension   . Irritable bowel syndrome   . Migraines     Review of systems:  Otherwise negative.    Physical Exam  Gen: Alert, oriented. Appears stated age.  HEENT: [place]/AT. PERRLA. Lungs: CTA, no wheezes. CV: RR nl S1, S2. Abd: soft, benign, no masses. BS+ Ext: No edema. Pulses 2+    Planned procedures: Proceed with colonoscopy. The patient understands the nature of the planned procedure, indications, risks, alternatives and potential complications including but not limited to bleeding, infection, perforation, damage to internal organs and possible oversedation/side effects from anesthesia. The patient agrees and gives consent to proceed.  Please refer to procedure notes for findings, recommendations and patient disposition/instructions.     Emily Walker K.  [name], M.D. Gastroenterology 06/17/2020  8:43 AM

## 2020-06-17 NOTE — Interval H&P Note (Signed)
History and Physical Interval Note:  06/17/2020 8:44 AM  Emily Walker  has presented today for surgery, with the diagnosis of History of adenomatous polyps.  The various methods of treatment have been discussed with the patient and family. After consideration of risks, benefits and other options for treatment, the patient has consented to  Procedure(s): COLONOSCOPY WITH PROPOFOL (N/A) as a surgical intervention.  The patient's history has been reviewed, patient examined, no change in status, stable for surgery.  I have reviewed the patient's chart and labs.  Questions were answered to the patient's satisfaction.     [place], [place]

## 2020-06-17 NOTE — Anesthesia Preprocedure Evaluation (Signed)
Anesthesia Evaluation  Patient identified by MRN, date of birth, ID band Patient awake    Reviewed: Allergy & Precautions, NPO status , Patient's Chart, lab work & pertinent test results  History of Anesthesia Complications Negative for: history of anesthetic complications  Airway Mallampati: III  TM Distance: >3 FB Neck ROM: Full    Dental no notable dental hx.    Pulmonary neg pulmonary ROS, neg sleep apnea, neg COPD,    breath sounds clear to auscultation- rhonchi (-) wheezing      Cardiovascular hypertension, Pt. on medications (-) angina(-) CAD, (-) Past MI and (-) Cardiac Stents  Rhythm:Regular Rate:Normal - Systolic murmurs and - Diastolic murmurs    Neuro/Psych  Headaches, neg Seizures PSYCHIATRIC DISORDERS Anxiety Depression    GI/Hepatic Neg liver ROS, GERD  ,  Endo/Other  negative endocrine ROSneg diabetes  Renal/GU negative Renal ROS     Musculoskeletal  (+) Arthritis ,   Abdominal (+) + obese,   Peds  Hematology negative hematology ROS (+)   Anesthesia Other Findings Past Medical History: No date: Anxiety No date: Arthritis No date: Constipation due to slow transit No date: Depression No date: Dysmenorrhea No date: Endometriosis of uterus No date: Fibroid No date: GERD (gastroesophageal reflux disease) No date: Hypertension No date: Irritable bowel syndrome No date: Migraines   Reproductive/Obstetrics                             Anesthesia Physical Anesthesia Plan  ASA: II  Anesthesia Plan: General   Post-op Pain Management:    Induction: Intravenous  PONV Risk Score and Plan: 2 and Propofol infusion  Airway Management Planned: Natural Airway  Additional Equipment:   Intra-op Plan:   Post-operative Plan:   Informed Consent: I have reviewed the patients History and Physical, chart, labs and discussed the procedure including the risks, benefits and  alternatives for the proposed anesthesia with the patient or authorized representative who has indicated his/her understanding and acceptance.     Dental advisory given  Plan Discussed with: CRNA and Anesthesiologist  Anesthesia Plan Comments:         Anesthesia Quick Evaluation

## 2020-06-17 NOTE — Op Note (Signed)
[facility] Patient Name: Emily Walker Procedure Date: 06/17/2020 8:06 AM MRN: [id number] Account #: [id number] Date of Birth: 11/08/1960 Admit Type: Outpatient Age: 60 Room: [facility] ENDO ROOM 2 Gender: Female Note Status: Finalized Procedure:             Colonoscopy Indications:           Surveillance: Personal history of adenomatous polyps                         on last colonoscopy > 5 years ago Providers:             Lorie Apley K. Tylia Ewell MD, MD Medicines:             Propofol per Anesthesia Complications:         No immediate complications. Procedure:             Pre-Anesthesia Assessment:                        - The risks and benefits of the procedure and the                         sedation options and risks were discussed with the                         patient. All questions were answered and informed                         consent was obtained.                        - Patient identification and proposed procedure were                         verified prior to the procedure by the nurse. The                         procedure was verified in the procedure room.                        - ASA Grade Assessment: III - A patient with severe                         systemic disease.                        - After reviewing the risks and benefits, the patient                         was deemed in satisfactory condition to undergo the                         procedure.                        After obtaining informed consent, the colonoscope was                         passed under direct vision. Throughout the procedure,  the patient's blood pressure, pulse, and oxygen                         saturations were monitored continuously. The                         Colonoscope was introduced through the anus and                         advanced to the the cecum, identified by appendiceal                         orifice and  ileocecal valve. The colonoscopy was                         performed without difficulty. The patient tolerated                         the procedure well. The quality of the bowel                         preparation was good. The ileocecal valve, appendiceal                         orifice, and rectum were photographed. Findings:      The perianal and digital rectal examinations were normal. Pertinent       negatives include normal sphincter tone and no palpable rectal lesions.      Non-bleeding internal hemorrhoids were found during retroflexion. The       hemorrhoids were Grade I (internal hemorrhoids that do not prolapse).      The colon (entire examined portion) appeared normal. Impression:            - Non-bleeding internal hemorrhoids.                        - The entire examined colon is normal.                        - No specimens collected. Recommendation:        - Patient has a contact number available for                         emergencies. The signs and symptoms of potential                         delayed complications were discussed with the patient.                         Return to normal activities tomorrow. Written                         discharge instructions were provided to the patient.                        - Resume previous diet.                        - Continue present medications.                        -  Resume Eliquis (apixaban) at prior dose today. Refer                         to managing physician for further adjustment of                         therapy.                        - Repeat colonoscopy in 5 years for surveillance.                        - Return to GI office PRN.                        - The findings and recommendations were discussed with                         the patient. Procedure Code(s):     --- Professional ---                        O3500, Colorectal cancer screening; colonoscopy on                         individual at high  risk Diagnosis Code(s):     --- Professional ---                        K64.0, First degree hemorrhoids                        Z86.010, Personal history of colonic polyps CPT copyright 2019 [employer]. All rights reserved. The codes documented in this report are preliminary and upon coder review may  be revised to meet current compliance requirements. Efrain Sella MD, MD 06/17/2020 9:02:20 AM This report has been signed electronically. Number of Addenda: 0 Note Initiated On: 06/17/2020 8:06 AM Scope Withdrawal Time: 0 hours 6 minutes 13 seconds  Total Procedure Duration: 0 hours 11 minutes 0 seconds  Estimated Blood Loss:  Estimated blood loss: none.      [facility]

## 2020-06-17 NOTE — Transfer of Care (Signed)
Immediate Anesthesia Transfer of Care Note  Patient: Emily Walker  Procedure(s) Performed: COLONOSCOPY WITH PROPOFOL (N/A )  Patient Location: Endoscopy Unit  Anesthesia Type:General  Level of Consciousness: drowsy and patient cooperative  Airway & Oxygen Therapy: Patient Spontanous Breathing and Patient connected to face mask oxygen  Post-op Assessment: Report given to RN, Post -op Vital signs reviewed and stable and Patient moving all extremities  Post vital signs: Reviewed and stable  Last Vitals:  Vitals Value Taken Time  BP 94/60 06/17/20 0903  Temp 35.7 C 06/17/20 0902  Pulse 80 06/17/20 0905  Resp 18 06/17/20 0905  SpO2 100 % 06/17/20 0905  Vitals shown include unvalidated device data.  Last Pain:  Vitals:   06/17/20 0902  TempSrc: Temporal  PainSc: Asleep         Complications: No complications documented.

## 2020-06-17 NOTE — Anesthesia Postprocedure Evaluation (Signed)
Anesthesia Post Note  Patient: Emily Walker  Procedure(s) Performed: COLONOSCOPY WITH PROPOFOL (N/A )  Patient location during evaluation: Endoscopy Anesthesia Type: General Level of consciousness: awake and alert and oriented Pain management: pain level controlled Vital Signs Assessment: post-procedure vital signs reviewed and stable Respiratory status: spontaneous breathing, nonlabored ventilation and respiratory function stable Cardiovascular status: blood pressure returned to baseline and stable Postop Assessment: no signs of nausea or vomiting Anesthetic complications: no   No complications documented.   Last Vitals:  Vitals:   06/17/20 0902 06/17/20 0912  BP: 94/60   Pulse: 70   Resp: (!) 23 16  Temp: (!) 35.7 C   SpO2: 98%     Last Pain:  Vitals:   06/17/20 0922  TempSrc:   PainSc: 0-No pain                 Deklynn Charlet

## 2020-06-17 NOTE — Anesthesia Procedure Notes (Signed)
Procedure Name: General with mask airway Performed by: Fletcher-Harrison, Chloeann Alfred, CRNA Pre-anesthesia Checklist: Patient identified, Emergency Drugs available, Suction available and Patient being monitored Patient Re-evaluated:Patient Re-evaluated prior to induction Oxygen Delivery Method: Simple face mask Induction Type: IV induction Placement Confirmation: positive ETCO2 and CO2 detector Dental Injury: Teeth and Oropharynx as per pre-operative assessment        

## 2020-06-18 ENCOUNTER — Encounter: Payer: Self-pay | Admitting: Internal Medicine

## 2021-06-14 ENCOUNTER — Other Ambulatory Visit: Payer: Self-pay | Admitting: Internal Medicine

## 2021-06-14 DIAGNOSIS — N63 Unspecified lump in unspecified breast: Secondary | ICD-10-CM

## 2021-07-06 ENCOUNTER — Ambulatory Visit
Admission: RE | Admit: 2021-07-06 | Discharge: 2021-07-06 | Disposition: A | Discharge status: Home / Self Care | Payer: BC Managed Care – PPO | Source: Ambulatory Visit | Attending: Internal Medicine | Admitting: Internal Medicine

## 2021-07-06 DIAGNOSIS — N63 Unspecified lump in unspecified breast: Secondary | ICD-10-CM

## 2021-08-10 ENCOUNTER — Emergency Department
Admission: EM | Admit: 2021-08-10 | Discharge: 2021-08-10 | Discharge status: Against Medical Advice | Payer: BC Managed Care – PPO

## 2022-06-17 ENCOUNTER — Other Ambulatory Visit: Payer: Self-pay

## 2022-06-17 DIAGNOSIS — Z0000 Encounter for general adult medical examination without abnormal findings: Secondary | ICD-10-CM

## 2022-06-17 DIAGNOSIS — R911 Solitary pulmonary nodule: Secondary | ICD-10-CM

## 2022-06-30 ENCOUNTER — Ambulatory Visit
Admission: RE | Admit: 2022-06-30 | Discharge: 2022-06-30 | Disposition: A | Discharge status: Home / Self Care | Payer: BC Managed Care – PPO | Source: Ambulatory Visit | Attending: Internal Medicine | Admitting: Internal Medicine

## 2022-06-30 DIAGNOSIS — R911 Solitary pulmonary nodule: Secondary | ICD-10-CM | POA: Insufficient documentation

## 2022-06-30 DIAGNOSIS — Z0000 Encounter for general adult medical examination without abnormal findings: Secondary | ICD-10-CM | POA: Insufficient documentation

## 2023-08-29 ENCOUNTER — Other Ambulatory Visit: Payer: Self-pay | Admitting: Internal Medicine

## 2023-08-29 ENCOUNTER — Ambulatory Visit
Admission: RE | Admit: 2023-08-29 | Discharge: 2023-08-29 | Disposition: A | Discharge status: Home / Self Care | Source: Ambulatory Visit | Attending: Internal Medicine | Admitting: Internal Medicine

## 2023-08-29 DIAGNOSIS — G8929 Other chronic pain: Secondary | ICD-10-CM | POA: Diagnosis present

## 2023-08-29 DIAGNOSIS — R1031 Right lower quadrant pain: Secondary | ICD-10-CM | POA: Insufficient documentation

## 2023-08-29 MED ORDER — IOHEXOL 300 MG/ML  SOLN
100.0000 mL | Freq: Once | INTRAMUSCULAR | Status: AC | PRN
  Administered 2023-08-29: 100 mL via INTRAVENOUS

## 2023-08-30 ENCOUNTER — Encounter: Payer: Self-pay | Admitting: Internal Medicine
